# Patient Record
Sex: Male | Born: 1985 | Race: Black or African American | Hispanic: No | Marital: Single | Smoking: Current some day smoker
Health system: Southern US, Community
[De-identification: ages and names within clinical notes are randomized; demographics above are authoritative.]

## PROBLEM LIST (undated history)

## (undated) ENCOUNTER — Emergency Department: Admission: EM | Payer: Self-pay | Source: Home / Self Care

## (undated) HISTORY — PX: KNEE SURGERY: SHX244

## (undated) HISTORY — PX: ANTERIOR CRUCIATE LIGAMENT REPAIR: SHX115

---

## 2008-07-17 ENCOUNTER — Emergency Department: Payer: Self-pay | Admitting: Emergency Medicine

## 2008-09-15 ENCOUNTER — Emergency Department: Payer: Self-pay | Admitting: Emergency Medicine

## 2009-01-23 ENCOUNTER — Emergency Department: Payer: Self-pay | Admitting: Internal Medicine

## 2009-07-21 ENCOUNTER — Emergency Department: Payer: Self-pay | Admitting: Emergency Medicine

## 2009-11-03 ENCOUNTER — Emergency Department: Payer: Self-pay | Admitting: Internal Medicine

## 2011-01-18 ENCOUNTER — Emergency Department: Payer: Self-pay | Admitting: Emergency Medicine

## 2011-08-26 ENCOUNTER — Emergency Department: Payer: Self-pay | Admitting: Unknown Physician Specialty

## 2014-01-28 ENCOUNTER — Emergency Department: Payer: Self-pay | Admitting: Emergency Medicine

## 2014-12-06 ENCOUNTER — Emergency Department: Payer: Self-pay | Admitting: Student

## 2015-10-14 ENCOUNTER — Emergency Department: Payer: No Typology Code available for payment source

## 2015-10-14 ENCOUNTER — Encounter: Payer: Self-pay | Admitting: Emergency Medicine

## 2015-10-14 ENCOUNTER — Emergency Department
Admission: EM | Admit: 2015-10-14 | Discharge: 2015-10-14 | Disposition: A | Discharge status: Home / Self Care | Payer: No Typology Code available for payment source | Attending: Emergency Medicine | Admitting: Emergency Medicine

## 2015-10-14 DIAGNOSIS — Y9389 Activity, other specified: Secondary | ICD-10-CM | POA: Diagnosis not present

## 2015-10-14 DIAGNOSIS — S161XXA Strain of muscle, fascia and tendon at neck level, initial encounter: Secondary | ICD-10-CM

## 2015-10-14 DIAGNOSIS — S0990XA Unspecified injury of head, initial encounter: Secondary | ICD-10-CM | POA: Insufficient documentation

## 2015-10-14 DIAGNOSIS — F17200 Nicotine dependence, unspecified, uncomplicated: Secondary | ICD-10-CM | POA: Diagnosis not present

## 2015-10-14 DIAGNOSIS — S5002XA Contusion of left elbow, initial encounter: Secondary | ICD-10-CM

## 2015-10-14 DIAGNOSIS — M25511 Pain in right shoulder: Secondary | ICD-10-CM

## 2015-10-14 DIAGNOSIS — S4991XA Unspecified injury of right shoulder and upper arm, initial encounter: Secondary | ICD-10-CM | POA: Insufficient documentation

## 2015-10-14 DIAGNOSIS — Y92410 Unspecified street and highway as the place of occurrence of the external cause: Secondary | ICD-10-CM | POA: Diagnosis not present

## 2015-10-14 DIAGNOSIS — Y998 Other external cause status: Secondary | ICD-10-CM | POA: Insufficient documentation

## 2015-10-14 MED ORDER — OXYCODONE-ACETAMINOPHEN 5-325 MG PO TABS
1.0000 | ORAL_TABLET | Freq: Once | ORAL | Status: AC
  Administered 2015-10-14: 1 via ORAL
  Filled 2015-10-14: qty 1

## 2015-10-14 MED ORDER — IBUPROFEN 800 MG PO TABS: 800.0000 mg | ORAL_TABLET | Freq: Three times a day (TID) | ORAL | Status: DC | PRN

## 2015-10-14 MED ORDER — OXYCODONE-ACETAMINOPHEN 5-325 MG PO TABS: 1.0000 | ORAL_TABLET | ORAL | Status: DC | PRN
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
## 2015-10-14 NOTE — ED Notes (Signed)
Pt refusing wheelchair. 

## 2015-10-14 NOTE — ED Notes (Signed)
Xray called and states that they will be delaying the xray until the patients C spine is cleared due to patient c/o of head and neck pain.

## 2015-10-14 NOTE — Discharge Instructions (Signed)
You may apply ice to your right shoulder and left elbow for 20 minutes every 2-3 hours.  You may similarly apply ice or heat to your neck for 20 minutes every 2-3 hours.  You may take Motrin for mild to moderate pain and Percocet for severe pain.  Do not drive within 8 hours of taking Percocet.  Return to the emergency department for severe pain, vomiting, numbness tingling or weakness, or any other symptoms concerning to you.  Cervical Sprain A cervical sprain is an injury in the neck in which the strong, fibrous tissues (ligaments) that connect your neck bones stretch or tear. Cervical sprains can range from mild to severe. Severe cervical sprains can cause the neck vertebrae to be unstable. This can lead to damage of the spinal cord and can result in serious nervous system problems. The amount of time it takes for a cervical sprain to get better depends on the cause and extent of the injury. Most cervical sprains heal in 1 to 3 weeks. CAUSES  Severe cervical sprains may be caused by:   Contact sport injuries (such as from football, rugby, wrestling, hockey, auto racing, gymnastics, diving, martial arts, or boxing).   Motor vehicle collisions.   Whiplash injuries. This is an injury from a sudden forward and backward whipping movement of the head and neck.  Falls.  Mild cervical sprains may be caused by:   Being in an awkward position, such as while cradling a telephone between your ear and shoulder.   Sitting in a chair that does not offer proper support.   Working at a poorly Marketing executive station.   Looking up or down for long periods of time.  SYMPTOMS   Pain, soreness, stiffness, or a burning sensation in the front, back, or sides of the neck. This discomfort may develop immediately after the injury or slowly, 24 hours or more after the injury.   Pain or tenderness directly in the middle of the back of the neck.   Shoulder or upper back pain.   Limited  ability to move the neck.   Headache.   Dizziness.   Weakness, numbness, or tingling in the hands or arms.   Muscle spasms.   Difficulty swallowing or chewing.   Tenderness and swelling of the neck.  DIAGNOSIS  Most of the time your health care provider can diagnose a cervical sprain by taking your history and doing a physical exam. Your health care provider will ask about previous neck injuries and any known neck problems, such as arthritis in the neck. X-rays may be taken to find out if there are any other problems, such as with the bones of the neck. Other tests, such as a CT scan or MRI, may also be needed.  TREATMENT  Treatment depends on the severity of the cervical sprain. Mild sprains can be treated with rest, keeping the neck in place (immobilization), and pain medicines. Severe cervical sprains are immediately immobilized. Further treatment is done to help with pain, muscle spasms, and other symptoms and may include:  Medicines, such as pain relievers, numbing medicines, or muscle relaxants.   Physical therapy. This may involve stretching exercises, strengthening exercises, and posture training. Exercises and improved posture can help stabilize the neck, strengthen muscles, and help stop symptoms from returning.  HOME CARE INSTRUCTIONS   Put ice on the injured area.   Put ice in a plastic bag.   Place a towel between your skin and the bag.   Leave the ice  on for 15-20 minutes, 3-4 times a day.   If your injury was severe, you may have been given a cervical collar to wear. A cervical collar is a two-piece collar designed to keep your neck from moving while it heals.  Do not remove the collar unless instructed by your health care provider.  If you have long hair, keep it outside of the collar.  Ask your health care provider before making any adjustments to your collar. Minor adjustments may be required over time to improve comfort and reduce pressure on your  chin or on the back of your head.  Ifyou are allowed to remove the collar for cleaning or bathing, follow your health care provider's instructions on how to do so safely.  Keep your collar clean by wiping it with mild soap and water and drying it completely. If the collar you have been given includes removable pads, remove them every 1-2 days and hand wash them with soap and water. Allow them to air dry. They should be completely dry before you wear them in the collar.  If you are allowed to remove the collar for cleaning and bathing, wash and dry the skin of your neck. Check your skin for irritation or sores. If you see any, tell your health care provider.  Do not drive while wearing the collar.   Only take over-the-counter or prescription medicines for pain, discomfort, or fever as directed by your health care provider.   Keep all follow-up appointments as directed by your health care provider.   Keep all physical therapy appointments as directed by your health care provider.   Make any needed adjustments to your workstation to promote good posture.   Avoid positions and activities that make your symptoms worse.   Warm up and stretch before being active to help prevent problems.  SEEK MEDICAL CARE IF:   Your pain is not controlled with medicine.   You are unable to decrease your pain medicine over time as planned.   Your activity level is not improving as expected.  SEEK IMMEDIATE MEDICAL CARE IF:   You develop any bleeding.  You develop stomach upset.  You have signs of an allergic reaction to your medicine.   Your symptoms get worse.   You develop new, unexplained symptoms.   You have numbness, tingling, weakness, or paralysis in any part of your body.  MAKE SURE YOU:   Understand these instructions.  Will watch your condition.  Will get help right away if you are not doing well or get worse.   This information is not intended to replace advice  given to you by your health care provider. Make sure you discuss any questions you have with your health care provider.   Document Released: 06/28/2007 Document Revised: 09/05/2013 Document Reviewed: 03/08/2013 Elsevier Interactive Patient Education [employer]
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
## 2015-10-14 NOTE — ED Notes (Signed)
Patient transported to x-ray. ?

## 2016-02-10 ENCOUNTER — Emergency Department
Admission: EM | Admit: 2016-02-10 | Discharge: 2016-02-10 | Disposition: A | Discharge status: Home / Self Care | Payer: No Typology Code available for payment source | Attending: Emergency Medicine | Admitting: Emergency Medicine

## 2016-02-10 DIAGNOSIS — K047 Periapical abscess without sinus: Secondary | ICD-10-CM | POA: Insufficient documentation

## 2016-02-10 DIAGNOSIS — F17200 Nicotine dependence, unspecified, uncomplicated: Secondary | ICD-10-CM | POA: Insufficient documentation

## 2016-02-10 MED ORDER — AMOXICILLIN 500 MG PO CAPS
1000.0000 mg | ORAL_CAPSULE | Freq: Once | ORAL | Status: AC
  Administered 2016-02-10: 1000 mg via ORAL
  Filled 2016-02-10: qty 2

## 2016-02-10 MED ORDER — LIDOCAINE-EPINEPHRINE 2 %-1:100000 IJ SOLN
INTRAMUSCULAR | Status: AC
  Administered 2016-02-10: 22:00:00
  Filled 2016-02-10: qty 1.7

## 2016-02-10 MED ORDER — AMOXICILLIN 500 MG PO TABS: 500.0000 mg | ORAL_TABLET | Freq: Three times a day (TID) | ORAL | Status: DC

## 2016-02-10 MED ORDER — HYDROCODONE-ACETAMINOPHEN 5-325 MG PO TABS: 1.0000 | ORAL_TABLET | ORAL | Status: DC | PRN

## 2016-02-10 NOTE — ED Notes (Signed)
Pt presents with right upper tooth is causing him pain since last night. Pt states he tried to pull it himself to make the pain go away last night. Pt states Tylenol is not touching the pain. Slight facial swelling noted.

## 2016-02-10 NOTE — ED Notes (Signed)
Discharge instructions reviewed with patient. Questions fielded by this RN. Patient verbalizes understanding of instructions. Patient discharged home in stable condition per [place] NP . No acute distress noted at time of discharge.   

## 2016-02-10 NOTE — ED Provider Notes (Signed)
[facility] Emergency Department Provider Note ____________________________________________  Time seen: Approximately 9:39 PM  I have reviewed the triage vital signs and the nursing notes.   HISTORY  Chief Complaint Dental Pain   HPI Gregory Oneal is a 30 y.o. male who presents to the emergency department tonight for evaluation of dental pain. He has had chronic dental fractures for quite some time, but they have not been painful until the past 24 hours. He states that he actually attempted to pull his tooth last night without any success. He has been taking Tylenol without relief. He denies fever.   History reviewed. No pertinent past medical history.  There are no active problems to display for this patient.   Past Surgical History  Procedure Laterality Date  . Knee surgery      Current Outpatient Rx  Name  Route  Sig  Dispense  Refill  . amoxicillin (AMOXIL) 500 MG tablet   Oral   Take 1 tablet (500 mg total) by mouth 3 (three) times daily.   30 tablet   0   . HYDROcodone-acetaminophen (NORCO/VICODIN) 5-325 MG tablet   Oral   Take 1 tablet by mouth every 4 (four) hours as needed.   12 tablet   0   . ibuprofen (ADVIL,MOTRIN) 800 MG tablet   Oral   Take 1 tablet (800 mg total) by mouth every 8 (eight) hours as needed.   20 tablet   0   . oxyCODONE-acetaminophen (PERCOCET/ROXICET) 5-325 MG tablet   Oral   Take 1 tablet by mouth every 4 (four) hours as needed for severe pain.   15 tablet   0     Allergies Review of patient's allergies indicates no known allergies.  No family history on file.  Social History Social History  Substance Use Topics  . Smoking status: Current Some Day Smoker  . Smokeless tobacco: None  . Alcohol Use: Yes     Comment: sometimes    Review of Systems Constitutional: Well appearing ENT: Positive for dental pain Musculoskeletal: Negative for jaw pain/trismus. Skin: Positive for upper jaw  swelling ____________________________________________   PHYSICAL EXAM:  VITAL SIGNS: ED Triage Vitals  Enc Vitals Group     BP 02/10/16 2107 134/81 mmHg     Pulse Rate 02/10/16 2107 61     Resp 02/10/16 2107 18     Temp 02/10/16 2107 98.4 F (36.9 C)     Temp Source 02/10/16 2107 Oral     SpO2 02/10/16 2107 99 %     Weight 02/10/16 2107 270 lb (122.471 kg)     Height 02/10/16 2107 6\' 5"  (1.956 m)     Head Cir --      Peak Flow --      Pain Score 02/10/16 2110 10     Pain Loc --      Pain Edu? --      Excl. in GC? --     Constitutional: Alert and oriented. Well appearing and in no acute distress. Eyes: Conjunctivae are normal.EOMI. Mouth/Throat: Mucous membranes are moist. Oropharynx non-erythematous. Periodontal Exam    Hematological/Lymphatic/Immunilogical: No cervical lymphadenopathy. Respiratory: Normal respiratory effort.  Musculoskeletal: Full ROM x 4 extremities. Neurologic:  Normal speech and language. No gross focal neurologic deficits are appreciated. Speech is normal. No gait instability. Skin:  Mild edema of the right cheek. Psychiatric: Mood and affect are normal. Speech and behavior are normal.  ____________________________________________   LABS (all labs ordered are listed, but only abnormal  results are displayed)  Labs Reviewed - No data to display ____________________________________________   RADIOLOGY  Not indicated ____________________________________________   PROCEDURES  Procedure(s) performed:  Dental block performed with lidocaine and epinephrine. Superior alveolar block completed successfully with no immediate complications.  Critical Care performed: No  ____________________________________________   INITIAL IMPRESSION / ASSESSMENT AND PLAN / ED COURSE  Pertinent labs & imaging results that were available during my care of the patient were reviewed by me and considered in my medical decision making (see chart for  details). Patient will be prescribed amoxicillin and Norco. Patient was advised to see the dentist within 14 days. Also advised to take the antibiotic until finished. Instructed to return to the ER for symptoms that change or worsen if you are unable to schedule an appointment. ____________________________________________   FINAL CLINICAL IMPRESSION(S) / ED DIAGNOSES  Final diagnoses:  Dental abscess    Note:  This document was prepared using Dragon voice recognition software and may include unintentional dictation errors.    Chinita Pester, FNP 02/10/16 2213  Jene Every, MD 02/10/16 2234

## 2016-02-10 NOTE — Discharge Instructions (Signed)

## 2016-02-10 NOTE — ED Notes (Signed)
Right upper tooth is causing him pain. States he tried to pull it himself to make the pain go away. States Tylenol is not touching the pain. Slight facial swelling noted.

## 2016-06-15 ENCOUNTER — Emergency Department
Admission: EM | Admit: 2016-06-15 | Discharge: 2016-06-15 | Disposition: A | Discharge status: Home / Self Care | Payer: Self-pay | Attending: Emergency Medicine | Admitting: Emergency Medicine

## 2016-06-15 ENCOUNTER — Encounter: Payer: Self-pay | Admitting: Emergency Medicine

## 2016-06-15 DIAGNOSIS — Y999 Unspecified external cause status: Secondary | ICD-10-CM | POA: Insufficient documentation

## 2016-06-15 DIAGNOSIS — Y939 Activity, unspecified: Secondary | ICD-10-CM | POA: Insufficient documentation

## 2016-06-15 DIAGNOSIS — F17200 Nicotine dependence, unspecified, uncomplicated: Secondary | ICD-10-CM | POA: Insufficient documentation

## 2016-06-15 DIAGNOSIS — Z79899 Other long term (current) drug therapy: Secondary | ICD-10-CM | POA: Insufficient documentation

## 2016-06-15 DIAGNOSIS — K029 Dental caries, unspecified: Secondary | ICD-10-CM

## 2016-06-15 DIAGNOSIS — K0889 Other specified disorders of teeth and supporting structures: Secondary | ICD-10-CM | POA: Insufficient documentation

## 2016-06-15 DIAGNOSIS — Y929 Unspecified place or not applicable: Secondary | ICD-10-CM | POA: Insufficient documentation

## 2016-06-15 DIAGNOSIS — Z5321 Procedure and treatment not carried out due to patient leaving prior to being seen by health care provider: Secondary | ICD-10-CM | POA: Insufficient documentation

## 2016-06-15 DIAGNOSIS — S025XXA Fracture of tooth (traumatic), initial encounter for closed fracture: Secondary | ICD-10-CM | POA: Insufficient documentation

## 2016-06-15 DIAGNOSIS — Z792 Long term (current) use of antibiotics: Secondary | ICD-10-CM | POA: Insufficient documentation

## 2016-06-15 DIAGNOSIS — X58XXXA Exposure to other specified factors, initial encounter: Secondary | ICD-10-CM | POA: Insufficient documentation

## 2016-06-15 MED ORDER — TRAMADOL HCL 50 MG PO TABS: 50.0000 mg | ORAL_TABLET | Freq: Four times a day (QID) | ORAL | 0 refills | Status: AC | PRN

## 2016-06-15 MED ORDER — LIDOCAINE VISCOUS 2 % MT SOLN
15.0000 mL | Freq: Once | OROMUCOSAL | Status: AC
  Administered 2016-06-15: 15 mL via OROMUCOSAL
  Filled 2016-06-15: qty 15

## 2016-06-15 MED ORDER — LIDOCAINE VISCOUS 2 % MT SOLN: 5.0000 mL | Freq: Four times a day (QID) | OROMUCOSAL | 0 refills | Status: DC | PRN

## 2016-06-15 MED ORDER — AMOXICILLIN 500 MG PO CAPS: 500.0000 mg | ORAL_CAPSULE | Freq: Three times a day (TID) | ORAL | 0 refills | Status: DC

## 2016-06-15 NOTE — ED Triage Notes (Signed)
Dental pain which started 3 days ago  States he broke a tooth

## 2016-06-15 NOTE — ED Triage Notes (Signed)
Pt just seen for dental pain and was discharged. Pt states the numbing cream is not working and request to speak with the doctor pt states " I want some tylenol". Pt advised that he would have to check back in to see the doctor. States he has money to go get some tylenol but does not want to.

## 2016-06-15 NOTE — ED Provider Notes (Signed)
[facility] Emergency Department Provider Note   ____________________________________________   First MD Initiated Contact with Patient 06/15/16 1355     (approximate)  I have reviewed the triage vital signs and the nursing notes.   HISTORY  Chief Complaint No chief complaint on file.    HPI Gregory Oneal is a 30 y.o. male patient complaining of dental pain for 3 days. Patient stated pain is secondary to a fractured tooth right upper molar. Patient also has multiple dental caries. Patient state taking over-the-counter anti-inflammatory medication with no results with pain relief. Patient rates pain as a 10 over 10. No other palliative measures for this complaint. Patient seen for previous complaint 4 months ago. Patient has not follow-up with dentist as directed. Patient say felt better after taking antibiotics and pain medication. Patient admits to not having dental insurance.   No past medical history on file.  There are no active problems to display for this patient.   Past Surgical History:  Procedure Laterality Date  . KNEE SURGERY      Prior to Admission medications   Medication Sig Start Date End Date Taking? Authorizing Provider  amoxicillin (AMOXIL) 500 MG capsule Take 1 capsule (500 mg total) by mouth 3 (three) times daily. 06/15/16   Joni Reiningonald K Linley Moxley, PA-C  amoxicillin (AMOXIL) 500 MG tablet Take 1 tablet (500 mg total) by mouth 3 (three) times daily. 02/10/16   Chinita Pesterari B Triplett, FNP  HYDROcodone-acetaminophen (NORCO/VICODIN) 5-325 MG tablet Take 1 tablet by mouth every 4 (four) hours as needed. 02/10/16   Chinita Pesterari B Triplett, FNP  ibuprofen (ADVIL,MOTRIN) 800 MG tablet Take 1 tablet (800 mg total) by mouth every 8 (eight) hours as needed. 10/14/15   Anne-Caroline Sharma CovertNorman, MD  lidocaine (XYLOCAINE) 2 % solution Use as directed 5 mLs in the mouth or throat every 6 (six) hours as needed for mouth pain. 06/15/16   Joni Reiningonald K Dianelly Ferran, PA-C    oxyCODONE-acetaminophen (PERCOCET/ROXICET) 5-325 MG tablet Take 1 tablet by mouth every 4 (four) hours as needed for severe pain. 10/14/15   Anne-Caroline Sharma CovertNorman, MD  traMADol (ULTRAM) 50 MG tablet Take 1 tablet (50 mg total) by mouth every 6 (six) hours as needed. 06/15/16 06/15/17  Joni Reiningonald K Jaysin Gayler, PA-C    Allergies Review of patient's allergies indicates no known allergies.  No family history on file.  Social History Social History  Substance Use Topics  . Smoking status: Current Some Day Smoker  . Smokeless tobacco: Not on file  . Alcohol use Yes     Comment: sometimes    Review of Systems Constitutional: No fever/chills Eyes: No visual changes. ENT: No sore throat.Dental pain Cardiovascular: Denies chest pain. Respiratory: Denies shortness of breath. Gastrointestinal: No abdominal pain.  No nausea, no vomiting.  No diarrhea.  No constipation. Genitourinary: Negative for dysuria. Musculoskeletal: Negative for back pain. Skin: Negative for rash. Neurological: Negative for headaches, focal weakness or numbness.    ____________________________________________   PHYSICAL EXAM:  VITAL SIGNS: ED Triage Vitals  Enc Vitals Group     BP 06/15/16 1351 137/77     Pulse Rate 06/15/16 1351 76     Resp 06/15/16 1351 18     Temp 06/15/16 1351 98.1 F (36.7 C)     Temp Source 06/15/16 1351 Oral     SpO2 06/15/16 1351 99 %     Weight 06/15/16 1352 270 lb (122.5 kg)     Height 06/15/16 1352 6\' 5"  (1.956 m)  Head Circumference --      Peak Flow --      Pain Score --      Pain Loc --      Pain Edu? --      Excl. in GC? --     Constitutional: Alert and oriented. Well appearing and in no acute distress. Eyes: Conjunctivae are normal. PERRL. EOMI. Head: Atraumatic. Nose: No congestion/rhinnorhea. Mouth/Throat: Mucous membranes are moist. Fractured tooth #14 with multiple caries upper and lower denture. Oropharynx non-erythematous. Neck: No stridor.  No cervical spine  tenderness to palpation. Hematological/Lymphatic/Immunilogical: No cervical lymphadenopathy. Cardiovascular: Normal rate, regular rhythm. Grossly normal heart sounds.  Good peripheral circulation. Respiratory: Normal respiratory effort.  No retractions. Lungs CTAB. Gastrointestinal: Soft and nontender. No distention. No abdominal bruits. No CVA tenderness. Musculoskeletal: No lower extremity tenderness nor edema.  No joint effusions. Neurologic:  Normal speech and language. No gross focal neurologic deficits are appreciated. No gait instability. Skin:  Skin is warm, dry and intact. No rash noted. Psychiatric: Mood and affect are normal. Speech and behavior are normal.  ____________________________________________   LABS (all labs ordered are listed, but only abnormal results are displayed)  Labs Reviewed - No data to display ____________________________________________  EKG   ____________________________________________  RADIOLOGY   ____________________________________________   PROCEDURES  Procedure(s) performed: None  Procedures  Critical Care performed: No  ____________________________________________   INITIAL IMPRESSION / ASSESSMENT AND PLAN / ED COURSE  Pertinent labs & imaging results that were available during my care of the patient were reviewed by me and considered in my medical decision making (see chart for details).  Dental pain secondary to fracture tooth and multiple caries. Patient given discharge care instructions. Patient given a list of dental clinic for follow-up care. Patient prescription for amoxicillin, viscous lidocaine and tramadol.  Clinical Course     ____________________________________________   FINAL CLINICAL IMPRESSION(S) / ED DIAGNOSES  Final diagnoses:  Pain due to dental caries      NEW MEDICATIONS STARTED DURING THIS VISIT:  New Prescriptions   AMOXICILLIN (AMOXIL) 500 MG CAPSULE    Take 1 capsule (500 mg total) by  mouth 3 (three) times daily.   LIDOCAINE (XYLOCAINE) 2 % SOLUTION    Use as directed 5 mLs in the mouth or throat every 6 (six) hours as needed for mouth pain.   TRAMADOL (ULTRAM) 50 MG TABLET    Take 1 tablet (50 mg total) by mouth every 6 (six) hours as needed.     Note:  This document was prepared using Dragon voice recognition software and may include unintentional dictation errors.    Joni Reining, PA-C 06/15/16 1403    Nita Sickle, MD 06/16/16 [phone]

## 2016-06-15 NOTE — Discharge Instructions (Signed)
Follow-up for list of dental clinics provided. °OPTIONS FOR DENTAL FOLLOW UP CARE ° °[place] [facility] - Local Safety Net Dental Clinics °http://www.ncdhhs.gov/dph/oralhealth/services/safetynetclinics.htm °  °[facility] ([phone]) ° °[facility] [place] ([phone]) ° °[facility] ([phone] ext [phone]) ° °[facility] ([phone]) ° °[facility] ([phone]) °This clinic caters to the indigent population and is on a lottery system. °Location: °[facility], [address], [place] °Clinic Hours: °Wednesdays from 6pm - 9pm, patients seen by a lottery system. °For dates, call or go to www.med.[facility].edu/[facility]/patients/Dental-[facility] °Services: °Cleanings, fillings and simple extractions. °Payment Options: °DENTAL WORK IS FREE OF CHARGE. Bring proof of income or support. °Best way to get seen: °Arrive at 5:15 pm - this is a lottery, NOT first come/first serve, so arriving earlier will not increase your chances of being seen. °  °  °[facility] °[phone] °Select option 1 for emergencies °  °Location: °[facility], [address], [place] °Clinic Hours: °No walk-ins accepted - call the day before to schedule an appointment. °Check in times are 9:30 am and 1:30 pm. °Services: °Simple extractions, temporary fillings, pulpectomy/pulp debridement, uncomplicated abscess drainage. °Payment Options: °PAYMENT IS DUE AT THE TIME OF SERVICE.  Fee is usually $100-200, additional surgical procedures (e.g. abscess drainage) may be extra. °Cash, checks, Visa/MasterCard accepted.  Can file Medicaid if patient is covered for dental - patient should call case worker to check. °No discount for [facility] patients. °Best way to get seen: °MUST call the day before and get onto the schedule. Can usually be seen the next 1-2 days. No walk-ins accepted. °  °   °[facility] °[phone] °  °Location: °[facility], [address], [place] °Clinic Hours: °M, W, Th, F 8am or 1:30pm, Tues 9a or 1:30 - first come/first served. °Services: °Simple extractions, temporary fillings, uncomplicated abscess drainage.  You do not need to be an [place] resident. °Payment Options: °PAYMENT IS DUE AT THE TIME OF SERVICE. °Dental insurance, otherwise sliding scale - bring proof of income or support. °Depending on income and treatment needed, cost is usually $50-200. °Best way to get seen: °Arrive early as it is first come/first served. °  °  °[facility] °[phone] °  °Location: °[address] °Clinic Hours: °Mon-Thu 8a-5p °Services: °Most basic dental services including extractions and fillings. °Payment Options: °PAYMENT IS DUE AT THE TIME OF SERVICE. °Sliding scale, up to 50% off - bring proof if income or support. °Medicaid with dental option accepted. °Best way to get seen: °Call to schedule an appointment, can usually be seen within 2 weeks OR they will try to see walk-ins - show up at 8a or 2p (you may have to wait). °  °  °[facility] °[phone] °[place] RESIDENTS ONLY °  °Location: °[facility], [address], [place], [place] [address] °Clinic Hours: By appointment only. °Monday - Thursday 8am-5pm, Friday 8am-12pm °Services: Cleanings, fillings, extractions. °Payment Options: °PAYMENT IS DUE AT THE TIME OF SERVICE. °Cash, Visa or MasterCard. Sliding scale - $30 minimum per service. °Best way to get seen: °Come in to office, complete packet and make an appointment - need proof of income °or support monies for each household member and proof of [place] residence. °Usually takes about a month to get in. °  °  °[facility] °  [phone] °  °Location: °[address] [place] °Clinic Hours: Walk-in Urgent Care  Dental Services are offered Monday-Friday mornings only. °The numbers of emergencies accepted daily is limited to the number of °providers available. °Maximum 15 - Mondays, Wednesdays & Thursdays °Maximum 10 - Tuesdays & Fridays °Services: °You do not need to be a [place] resident to be seen for a dental emergency. °Emergencies are defined as pain, swelling, abnormal bleeding, or dental trauma. Walkins will receive x-rays if needed. °NOTE: Dental cleaning is not an emergency. °Payment Options: °PAYMENT IS DUE AT THE TIME OF SERVICE. °Minimum co-pay is $40.00 for uninsured patients. °Minimum co-pay is $3.00 for Medicaid with dental coverage. °Dental Insurance is accepted and must be presented at time of visit. °Medicare does not cover dental. °Forms of payment: Cash, credit card, checks. °Best way to get seen: °If not previously registered with the clinic, walk-in dental registration begins at 7:15 am and is on a first come/first serve basis. °If previously registered with the clinic, call to make an appointment. °  °  °[facility] °[phone] °[place] RESIDENTS ONLY °  °Location: °[address], [place], [place] °Clinic Hours: °Mon-Thu 10a-2p °Services: Extractions only! °Payment Options: °FREE (donations accepted) - bring proof of income or support °Best way to get seen: °Call and schedule an appointment OR come at 8am on the 1st Monday of every month (except for holidays) when it is first come/first served. °  °  °[facility] °[phone] °  °Location: °[address], [place] °Clinic Hours: °Friday mornings °Services, Payment Options, Best way to get seen: °Call for info ° °

## 2017-05-19 ENCOUNTER — Emergency Department
Admission: EM | Admit: 2017-05-19 | Discharge: 2017-05-19 | Disposition: A | Discharge status: Home / Self Care | Payer: Self-pay | Attending: Emergency Medicine | Admitting: Emergency Medicine

## 2017-05-19 ENCOUNTER — Encounter: Payer: Self-pay | Admitting: *Deleted

## 2017-05-19 DIAGNOSIS — K029 Dental caries, unspecified: Secondary | ICD-10-CM | POA: Insufficient documentation

## 2017-05-19 DIAGNOSIS — F17200 Nicotine dependence, unspecified, uncomplicated: Secondary | ICD-10-CM | POA: Insufficient documentation

## 2017-05-19 DIAGNOSIS — K047 Periapical abscess without sinus: Secondary | ICD-10-CM | POA: Insufficient documentation

## 2017-05-19 MED ORDER — PENICILLIN V POTASSIUM 500 MG PO TABS: 500.0000 mg | ORAL_TABLET | Freq: Four times a day (QID) | ORAL | 0 refills | Status: DC

## 2017-05-19 MED ORDER — PENICILLIN V POTASSIUM 500 MG PO TABS
500.0000 mg | ORAL_TABLET | Freq: Once | ORAL | Status: AC
  Administered 2017-05-19: 500 mg via ORAL
  Filled 2017-05-19: qty 1

## 2017-05-19 MED ORDER — LIDOCAINE-EPINEPHRINE 2 %-1:100000 IJ SOLN
1.7000 mL | Freq: Once | INTRAMUSCULAR | Status: AC
  Administered 2017-05-19: 1.7 mL
  Filled 2017-05-19: qty 1.7

## 2017-05-19 NOTE — ED Triage Notes (Signed)
Pt has right upper toothache since yesterday.  Pt taking otc meds without relief.  Pt alert.

## 2017-05-19 NOTE — ED Provider Notes (Signed)
[facility] Emergency Department Provider Note ____________________________________________  Time seen: 2220  I have reviewed the triage vital signs and the nursing notes.  HISTORY  Chief Complaint  Dental Pain  HPI Gregory Oneal is a 31 y.o. male Presents to the ED for evaluation of right upper tooth pain since yesterday. 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 He reports some purulent drainage from the buccal area of the same tooth. He denies any fevers, chills, sweats. 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  No past medical history on file.  There are no active problems to display for this patient.   Past Surgical History:  Procedure Laterality Date  . KNEE SURGERY      Prior to Admission medications   Medication Sig Start Date End Date Taking? Authorizing Provider  amoxicillin (AMOXIL) 500 MG capsule Take 1 capsule (500 mg total) by mouth 3 (three) times daily. 06/15/16   Joni Reining, PA-C  amoxicillin (AMOXIL) 500 MG tablet Take 1 tablet (500 mg total) by mouth 3 (three) times daily. 02/10/16   Triplett, Rulon Eisenmenger B, FNP  HYDROcodone-acetaminophen (NORCO/VICODIN) 5-325 MG tablet Take 1 tablet by mouth every 4 (four) hours as needed. 02/10/16   Triplett, Cari B, FNP  ibuprofen (ADVIL,MOTRIN) 800 MG tablet Take 1 tablet (800 mg total) by mouth every 8 (eight) hours as needed. 10/14/15   Rockne Menghini, MD  lidocaine (XYLOCAINE) 2 % solution Use as directed 5 mLs in the mouth or throat every 6 (six) hours as needed for mouth pain. 06/15/16   Joni Reining, PA-C  oxyCODONE-acetaminophen (PERCOCET/ROXICET) 5-325 MG tablet Take 1 tablet by mouth every 4 (four) hours as needed for severe pain. 10/14/15   Rockne Menghini, MD   penicillin v potassium (VEETID) 500 MG tablet Take 1 tablet (500 mg total) by mouth 4 (four) times daily. 05/19/17   Leily Capek, Charlesetta Ivory, PA-C  traMADol (ULTRAM) 50 MG tablet Take 1 tablet (50 mg total) by mouth every 6 (six) hours as needed. 06/15/16 06/15/17  Joni Reining, PA-C    Allergies Patient has no known allergies.  No family history on file.  Social History Social History  Substance Use Topics  . Smoking status: Current Some Day Smoker  . Smokeless tobacco: Never Used  . Alcohol use Yes     Comment: sometimes    Review of Systems  Constitutional: Negative for fever. Eyes: Negative for visual changes. ENT: Negative for sore throat. Dental pain as above Cardiovascular: Negative for chest pain. Respiratory: Negative for shortness of breath. Neurological: Negative for headaches, focal weakness or numbness. ____________________________________________  PHYSICAL EXAM:  VITAL SIGNS: ED Triage Vitals  Enc Vitals Group     BP 05/19/17 2100 129/67     Pulse Rate 05/19/17 2100 85     Resp 05/19/17 2100 18     Temp 05/19/17 2100 98.2 F (36.8 C)     Temp Source 05/19/17 2100 Oral     SpO2 05/19/17 2100 99 %     Weight 05/19/17 2058 275 lb (124.7 kg)     Height 05/19/17 2058 6\' 5"  (1.956 m)     Head Circumference --      Peak Flow --      Pain Score 05/19/17 2058 8  Pain Loc --      Pain Edu? --      Excl. in GC? --     Constitutional: Alert and oriented. Well appearing and in no distress. Head: Normocephalic and atraumatic. Eyes: Conjunctivae are normal. Normal extraocular movements Ears: Canals clear. TMs intact bilaterally. Mouth/Throat: Mucous membranes are moist. Patient with poor dentition primarily to the upper and lower molars. The right upper jaw where the patient localizes pain shows a fractured primary molar. He has a chronically fractured second and third molar to the gumline. No focal gum swelling is appreciated. No pointing fluctuance is noted.  No brawny sublingual erythema is noted. Neck: Supple. No thyromegaly. Hematological/Lymphatic/Immunological: No cervical lymphadenopathy. Cardiovascular: Normal rate, regular rhythm. Normal distal pulses. Respiratory: Normal respiratory effort. No wheezes/rales/rhonchi. ____________________________________________  PROCEDURES  Pen VK 500 mg PO  DENTAL BLOCK  Performed by: Lissa HoardMenshew, Aleyah Balik V Bacon Consent: Verbal consent obtained. Required items: devices and special equipment available Time out: Immediately prior to procedure a "time out" was called to verify the correct patient, procedure, equipment, support staff and site/side marked as required.  Indication: pain Nerve block body site: right upper molar  Preparation: Patient was prepped and draped in the usual sterile fashion. Needle gauge: 27 G Location technique: anatomical landmarks  Local anesthetic: lido-epi 2%-1:100000  Anesthetic total: 3 ml  Outcome: pain improved Patient tolerance: Patient tolerated the procedure well with no immediate complications. ____________________________________________  INITIAL IMPRESSION / ASSESSMENT AND PLAN / ED COURSE  Patient with ED evaluation of acute dental pain secondary to chronic chronic dental caries and chronic dental fracture toe gumline. Patient is beng treated for dental abscess with penicillin VK and will be discharged with instructions to follow-up with a dental provider on his dental insurance. ____________________________________________  FINAL CLINICAL IMPRESSION(S) / ED DIAGNOSES  Final diagnoses:  Pain due to dental caries  Dental infection      Lissa HoardMenshew, Camreigh Michie V Bacon, PA-C 05/21/17 0052    Loleta RoseForbach, Cory, MD 05/21/17 [phone]

## 2017-05-19 NOTE — Discharge Instructions (Signed)
Take the prescription antibiotic as directed. Rinse with warm salt-water after every meal. Use a soft-bristled toothbrush twice daily. Select and follow-up with dental provider on your dental insurance plan for definitive treatment.

## 2017-05-19 NOTE — ED Notes (Signed)
Pt ambulatory at time of discharge. Pt denied having questions. Pt in NAD.

## 2018-02-10 ENCOUNTER — Other Ambulatory Visit: Payer: Self-pay

## 2018-02-10 ENCOUNTER — Encounter: Payer: Self-pay | Admitting: *Deleted

## 2018-02-10 ENCOUNTER — Emergency Department
Admission: EM | Admit: 2018-02-10 | Discharge: 2018-02-10 | Disposition: A | Discharge status: Home / Self Care | Payer: Self-pay | Attending: Emergency Medicine | Admitting: Emergency Medicine

## 2018-02-10 DIAGNOSIS — F17200 Nicotine dependence, unspecified, uncomplicated: Secondary | ICD-10-CM | POA: Insufficient documentation

## 2018-02-10 DIAGNOSIS — B356 Tinea cruris: Secondary | ICD-10-CM | POA: Insufficient documentation

## 2018-02-10 MED ORDER — CICLOPIROX OLAMINE 0.77 % EX CREA: TOPICAL_CREAM | Freq: Two times a day (BID) | CUTANEOUS | 0 refills | Status: DC

## 2018-02-10 NOTE — ED Provider Notes (Signed)
[facility] Emergency Department Provider Note  ____________________________________________  Time seen: Approximately 11:01 PM  I have reviewed the triage vital signs and the nursing notes.   HISTORY  Chief Complaint Rash    HPI Gregory Oneal is a 32 y.o. male presents to the emergency department with a rash to the right groin for approximately 2 weeks that is pruritic in nature.  Patient reports that he plays contact sports and wears tight fitting clothing.  He denies fever or chills.  No dysuria. No discharge from the penis.    No past medical history on file.  There are no active problems to display for this patient.   Past Surgical History:  Procedure Laterality Date  . KNEE SURGERY      Prior to Admission medications   Medication Sig Start Date End Date Taking? Authorizing Provider  amoxicillin (AMOXIL) 500 MG capsule Take 1 capsule (500 mg total) by mouth 3 (three) times daily. 06/15/16   Joni Reining, PA-C  amoxicillin (AMOXIL) 500 MG tablet Take 1 tablet (500 mg total) by mouth 3 (three) times daily. 02/10/16   Triplett, Cari B, FNP  ciclopirox (LOPROX) 0.77 % cream Apply topically 2 (two) times daily. 02/10/18   Orvil Feil, PA-C  HYDROcodone-acetaminophen (NORCO/VICODIN) 5-325 MG tablet Take 1 tablet by mouth every 4 (four) hours as needed. 02/10/16   Triplett, Cari B, FNP  ibuprofen (ADVIL,MOTRIN) 800 MG tablet Take 1 tablet (800 mg total) by mouth every 8 (eight) hours as needed. 10/14/15   Rockne Menghini, MD  lidocaine (XYLOCAINE) 2 % solution Use as directed 5 mLs in the mouth or throat every 6 (six) hours as needed for mouth pain. 06/15/16   Joni Reining, PA-C  oxyCODONE-acetaminophen (PERCOCET/ROXICET) 5-325 MG tablet Take 1 tablet by mouth every 4 (four) hours as needed for severe pain. 10/14/15   Rockne Menghini, MD  penicillin v potassium (VEETID) 500 MG tablet Take 1 tablet (500 mg total) by mouth 4 (four) times  daily. 05/19/17   Menshew, Charlesetta Ivory, PA-C    Allergies Patient has no known allergies.  No family history on file.  Social History Social History   Tobacco Use  . Smoking status: Current Some Day Smoker  . Smokeless tobacco: Never Used  Substance Use Topics  . Alcohol use: Yes    Frequency: Never    Comment: sometimes  . Drug use: No    Comment: none in last 90 days     Review of Systems  Constitutional: No fever/chills Eyes: No visual changes. No discharge ENT: No upper respiratory complaints. Cardiovascular: no chest pain. Respiratory: no cough. No SOB. Gastrointestinal: No abdominal pain.  No nausea, no vomiting.  No diarrhea.  No constipation. Musculoskeletal: Negative for musculoskeletal pain. Skin: Patient has rash along groin.  Neurological: Negative for headaches, focal weakness or numbness.   ____________________________________________   PHYSICAL EXAM:  VITAL SIGNS: ED Triage Vitals  Enc Vitals Group     BP 02/10/18 2016 100/74     Pulse Rate 02/10/18 2016 79     Resp 02/10/18 2016 18     Temp 02/10/18 2016 99.4 F (37.4 C)     Temp Source 02/10/18 2016 Oral     SpO2 02/10/18 2016 99 %     Weight 02/10/18 2017 268 lb (121.6 kg)     Height 02/10/18 2017  (1.956 m)     Head Circumference --      Peak Flow --  Pain Score 02/10/18 2017 0     Pain Loc --      Pain Edu? --      Excl. in GC? --      Constitutional: Alert and oriented. Well appearing and in no acute distress. Eyes: Conjunctivae are normal. PERRL. EOMI. Head: Atraumatic. Cardiovascular: Normal rate, regular rhythm. Normal S1 and S2.  Good peripheral circulation. Respiratory: Normal respiratory effort without tachypnea or retractions. Lungs CTAB. Good air entry to the bases with no decreased or absent breath sounds. Musculoskeletal: Full range of motion to all extremities. No gross deformities appreciated. Neurologic:  Normal speech and language. No gross focal  neurologic deficits are appreciated.  Skin: Patient has erythematous rash along proximal thighs with irregular borders.  ____________________________________________   LABS (all labs ordered are listed, but only abnormal results are displayed)  Labs Reviewed - No data to display ____________________________________________  EKG   ____________________________________________  RADIOLOGY   No results found.  ____________________________________________    PROCEDURES  Procedure(s) performed:    Procedures    Medications - No data to display   ____________________________________________   INITIAL IMPRESSION / ASSESSMENT AND PLAN / ED COURSE  Pertinent labs & imaging results that were available during my care of the patient were reviewed by me and considered in my medical decision making (see chart for details).  Review of the [place] CSRS was performed in accordance of the [facility] prior to dispensing any controlled drugs.    Assessment and plan Tinea cruris Patient presents to the emergency department with an erythematous rash of the groin.  Patient was discharged with ciclopirox advised to follow-up with primary care.  All patient questions were answered.    ____________________________________________  FINAL CLINICAL IMPRESSION(S) / ED DIAGNOSES  Final diagnoses:  Tinea cruris      NEW MEDICATIONS STARTED DURING THIS VISIT:  ED Discharge Orders        Ordered    ciclopirox (LOPROX) 0.77 % cream  2 times daily     02/10/18 2139          This chart was dictated using voice recognition software/Dragon. Despite best efforts to proofread, errors can occur which can change the meaning. Any change was purely unintentional.    Orvil Feil, PA-C 02/10/18 2307    Arnaldo Natal, MD 02/11/18 [phone]

## 2018-02-10 NOTE — ED Triage Notes (Signed)
Pt has rash to right groin area for 2 weeks.  Pt has itching.  Pt alert.

## 2018-05-19 ENCOUNTER — Emergency Department
Admission: EM | Admit: 2018-05-19 | Discharge: 2018-05-19 | Disposition: A | Discharge status: Home / Self Care | Payer: Self-pay | Attending: Student in an Organized Health Care Education/Training Program | Admitting: Student in an Organized Health Care Education/Training Program

## 2018-05-19 ENCOUNTER — Encounter: Payer: Self-pay | Admitting: Emergency Medicine

## 2018-05-19 ENCOUNTER — Other Ambulatory Visit: Payer: Self-pay

## 2018-05-19 DIAGNOSIS — Z79899 Other long term (current) drug therapy: Secondary | ICD-10-CM | POA: Insufficient documentation

## 2018-05-19 DIAGNOSIS — F17210 Nicotine dependence, cigarettes, uncomplicated: Secondary | ICD-10-CM | POA: Insufficient documentation

## 2018-05-19 DIAGNOSIS — R369 Urethral discharge, unspecified: Secondary | ICD-10-CM | POA: Insufficient documentation

## 2018-05-19 DIAGNOSIS — R350 Frequency of micturition: Secondary | ICD-10-CM | POA: Insufficient documentation

## 2018-05-19 LAB — URINALYSIS, COMPLETE (UACMP) WITH MICROSCOPIC
BILIRUBIN URINE: NEGATIVE
Bacteria, UA: NONE SEEN
GLUCOSE, UA: NEGATIVE mg/dL
Hgb urine dipstick: NEGATIVE
Ketones, ur: NEGATIVE mg/dL
NITRITE: NEGATIVE
Protein, ur: NEGATIVE mg/dL
SPECIFIC GRAVITY, URINE: 1.02 (ref 1.005–1.030)
Squamous Epithelial / LPF: NONE SEEN (ref 0–5)
pH: 6 (ref 5.0–8.0)

## 2018-05-19 LAB — CHLAMYDIA/NGC RT PCR (ARMC ONLY)
Chlamydia Tr: NOT DETECTED
N gonorrhoeae: NOT DETECTED

## 2018-05-19 MED ORDER — METRONIDAZOLE 500 MG PO TABS
2000.0000 mg | ORAL_TABLET | Freq: Once | ORAL | Status: AC
  Administered 2018-05-19: 2000 mg via ORAL
  Filled 2018-05-19: qty 4

## 2018-05-19 MED ORDER — CEFTRIAXONE SODIUM 250 MG IJ SOLR
250.0000 mg | Freq: Once | INTRAMUSCULAR | Status: AC
  Administered 2018-05-19: 250 mg via INTRAMUSCULAR
  Filled 2018-05-19: qty 250

## 2018-05-19 MED ORDER — AZITHROMYCIN 500 MG PO TABS
1000.0000 mg | ORAL_TABLET | Freq: Once | ORAL | Status: AC
  Administered 2018-05-19: 1000 mg via ORAL
  Filled 2018-05-19: qty 2

## 2018-05-19 NOTE — ED Triage Notes (Signed)
Pt reports that he has been having frequency and urination, denies any pain but seen that he had some discharge and wants to be checked out

## 2018-05-19 NOTE — Discharge Instructions (Signed)
Please follow up with the health department next week if symptoms are not improving. All partners should be tested for STDs. Return to the emergency department for worsening symptoms.

## 2018-05-19 NOTE — ED Provider Notes (Signed)
[facility] Emergency Department Provider Note  ____________________________________________  Time seen: Approximately 9:26 PM  I have reviewed the triage vital signs and the nursing notes.   HISTORY  Chief Complaint SEXUALLY TRANSMITTED DISEASE    HPI Gregory Oneal is a 32 y.o. male that presents emergency department for evaluation of increased frequency of urination for a couple of weeks and a small amount of penile discharge after urination today.  Patient is not aware of any exposure to STD.  No abdominal pain, back pain, hematuria.  History reviewed. No pertinent past medical history.  There are no active problems to display for this patient.   Past Surgical History:  Procedure Laterality Date  . KNEE SURGERY      Prior to Admission medications   Medication Sig Start Date End Date Taking? Authorizing Provider  amoxicillin (AMOXIL) 500 MG capsule Take 1 capsule (500 mg total) by mouth 3 (three) times daily. 06/15/16   Joni Reining, PA-C  amoxicillin (AMOXIL) 500 MG tablet Take 1 tablet (500 mg total) by mouth 3 (three) times daily. 02/10/16   Triplett, Cari B, FNP  ciclopirox (LOPROX) 0.77 % cream Apply topically 2 (two) times daily. 02/10/18   Orvil Feil, PA-C  HYDROcodone-acetaminophen (NORCO/VICODIN) 5-325 MG tablet Take 1 tablet by mouth every 4 (four) hours as needed. 02/10/16   Triplett, Cari B, FNP  ibuprofen (ADVIL,MOTRIN) 800 MG tablet Take 1 tablet (800 mg total) by mouth every 8 (eight) hours as needed. 10/14/15   Rockne Menghini, MD  lidocaine (XYLOCAINE) 2 % solution Use as directed 5 mLs in the mouth or throat every 6 (six) hours as needed for mouth pain. 06/15/16   Joni Reining, PA-C  oxyCODONE-acetaminophen (PERCOCET/ROXICET) 5-325 MG tablet Take 1 tablet by mouth every 4 (four) hours as needed for severe pain. 10/14/15   Rockne Menghini, MD  penicillin v potassium (VEETID) 500 MG tablet Take 1 tablet (500 mg total) by  mouth 4 (four) times daily. 05/19/17   Menshew, Charlesetta Ivory, PA-C    Allergies Patient has no known allergies.  History reviewed. No pertinent family history.  Social History Social History   Tobacco Use  . Smoking status: Current Some Day Smoker  . Smokeless tobacco: Never Used  Substance Use Topics  . Alcohol use: Yes    Frequency: Never    Comment: sometimes  . Drug use: No    Comment: none in last 90 days     Review of Systems  Constitutional: No fever/chills Cardiovascular: No chest pain. Respiratory: No SOB. Gastrointestinal: No abdominal pain.  No nausea, no vomiting.  Musculoskeletal: Negative for musculoskeletal pain. Skin: Negative for rash, abrasions, lacerations, ecchymosis.   ____________________________________________   PHYSICAL EXAM:  VITAL SIGNS: ED Triage Vitals  Enc Vitals Group     BP 05/19/18 1904 (!) 142/76     Pulse Rate 05/19/18 1904 66     Resp 05/19/18 1904 20     Temp 05/19/18 1904 98.5 F (36.9 C)     Temp Source 05/19/18 1904 Oral     SpO2 05/19/18 1904 100 %     Weight 05/19/18 1905 260 lb (117.9 kg)     Height 05/19/18 1905 6\' 5"  (1.956 m)     Head Circumference --      Peak Flow --      Pain Score 05/19/18 1948 0     Pain Loc --      Pain Edu? --  Excl. in GC? --      Constitutional: Alert and oriented. Well appearing and in no acute distress. Eyes: Conjunctivae are normal. PERRL. EOMI. Head: Atraumatic. ENT:      Ears:      Nose: No congestion/rhinnorhea.      Mouth/Throat: Mucous membranes are moist.  Neck: No stridor.   Cardiovascular: Normal rate, regular rhythm.  Good peripheral circulation. Respiratory: Normal respiratory effort without tachypnea or retractions. Lungs CTAB. Good air entry to the bases with no decreased or absent breath sounds. Musculoskeletal: Full range of motion to all extremities. No gross deformities appreciated. Neurologic:  Normal speech and language. No gross focal neurologic  deficits are appreciated.  Skin:  Skin is warm, dry and intact. No rash noted. Psychiatric: Mood and affect are normal. Speech and behavior are normal. Patient exhibits appropriate insight and judgement.   ____________________________________________   LABS (all labs ordered are listed, but only abnormal results are displayed)  Labs Reviewed  URINALYSIS, COMPLETE (UACMP) WITH MICROSCOPIC - Abnormal; Notable for the following components:      Result Value   Color, Urine YELLOW (*)    APPearance CLEAR (*)    Leukocytes, UA TRACE (*)    All other components within normal limits  CHLAMYDIA/NGC RT PCR ([facility] ONLY)   ____________________________________________  EKG   ____________________________________________  RADIOLOGY  No results found.  ____________________________________________    PROCEDURES  Procedure(s) performed:    Procedures    Medications  cefTRIAXone (ROCEPHIN) injection 250 mg (250 mg Intramuscular Given 05/19/18 2138)  azithromycin (ZITHROMAX) tablet 1,000 mg (1,000 mg Oral Given 05/19/18 2138)  metroNIDAZOLE (FLAGYL) tablet 2,000 mg (2,000 mg Oral Given 05/19/18 2138)     ____________________________________________   INITIAL IMPRESSION / ASSESSMENT AND PLAN / ED COURSE  Pertinent labs & imaging results that were available during my care of the patient were reviewed by me and considered in my medical decision making (see chart for details).  Review of the [place] CSRS was performed in accordance of the [facility] prior to dispensing any controlled drugs.     Patient's diagnosis is consistent with pyuria.  Patient will be treated empirically for STDs with IM ceftriaxone, oral azithromycin and oral Flagyl.  He was educated that all partners need to be tested and treated.  Patient is to follow up with health department as directed. Patient is given ED precautions to return to the ED for any worsening or new  symptoms.     ____________________________________________  FINAL CLINICAL IMPRESSION(S) / ED DIAGNOSES  Final diagnoses:  Penile discharge      NEW MEDICATIONS STARTED DURING THIS VISIT:  ED Discharge Orders    None          This chart was dictated using voice recognition software/Dragon. Despite best efforts to proofread, errors can occur which can change the meaning. Any change was purely unintentional.    Enid Derry, PA-C 05/19/18 2254    Willy Eddy, MD 05/19/18 [phone]

## 2018-05-23 ENCOUNTER — Telehealth: Payer: Self-pay | Admitting: Emergency Medicine

## 2018-05-23 NOTE — Telephone Encounter (Signed)
Patient called asking for std test results.  Gave him results.

## 2018-05-24 LAB — HM HIV SCREENING LAB: HM HIV Screening: NEGATIVE

## 2019-01-02 ENCOUNTER — Encounter: Payer: Self-pay | Admitting: Emergency Medicine

## 2019-01-02 ENCOUNTER — Other Ambulatory Visit: Payer: Self-pay

## 2019-01-02 ENCOUNTER — Emergency Department
Admission: EM | Admit: 2019-01-02 | Discharge: 2019-01-02 | Disposition: A | Discharge status: Home / Self Care | Payer: Self-pay | Attending: Emergency Medicine | Admitting: Emergency Medicine

## 2019-01-02 DIAGNOSIS — M5416 Radiculopathy, lumbar region: Secondary | ICD-10-CM | POA: Insufficient documentation

## 2019-01-02 DIAGNOSIS — Z79899 Other long term (current) drug therapy: Secondary | ICD-10-CM | POA: Insufficient documentation

## 2019-01-02 DIAGNOSIS — F17210 Nicotine dependence, cigarettes, uncomplicated: Secondary | ICD-10-CM | POA: Insufficient documentation

## 2019-01-02 MED ORDER — METHOCARBAMOL 500 MG PO TABS: 500.0000 mg | ORAL_TABLET | Freq: Every evening | ORAL | 0 refills | Status: DC | PRN

## 2019-01-02 MED ORDER — MELOXICAM 15 MG PO TABS: 15.0000 mg | ORAL_TABLET | Freq: Every day | ORAL | 0 refills | Status: DC

## 2019-01-02 MED ORDER — KETOROLAC TROMETHAMINE 30 MG/ML IJ SOLN
30.0000 mg | Freq: Once | INTRAMUSCULAR | Status: AC
  Administered 2019-01-02: 30 mg via INTRAMUSCULAR
  Filled 2019-01-02: qty 1

## 2019-01-02 NOTE — ED Provider Notes (Signed)
[facility] Emergency Department Provider Note  ____________________________________________  Time seen: Approximately 6:47 PM  I have reviewed the triage vital signs and the nursing notes.   HISTORY  Chief Complaint Hip Pain    HPI Gregory PattenRobert Picking is a 33 y.o. male who presents the emergency department complaining of left hip/buttock pain that is been radiating down his left leg for approximately 3 weeks.  Patient reports that it is intermittent in nature but has been worsening and becoming more constant over the past several days.  Patient reports that it is a sharp sensation with a burning/tingling sensation running down the left leg.  No known trauma or injury.  Patient denies any back pain.  He is ambulatory with no difficulty.  No history of same in the past.  No bowel bladder dysfunction, saddle anesthesia or paresthesias.  No other complaints at this time.         History reviewed. No pertinent past medical history.  There are no active problems to display for this patient.   Past Surgical History:  Procedure Laterality Date  . KNEE SURGERY      Prior to Admission medications   Medication Sig Start Date End Date Taking? Authorizing Provider  amoxicillin (AMOXIL) 500 MG capsule Take 1 capsule (500 mg total) by mouth 3 (three) times daily. 06/15/16   Joni ReiningSmith, Ronald K, PA-C  amoxicillin (AMOXIL) 500 MG tablet Take 1 tablet (500 mg total) by mouth 3 (three) times daily. 02/10/16   Triplett, Cari B, FNP  ciclopirox (LOPROX) 0.77 % cream Apply topically 2 (two) times daily. 02/10/18   Orvil FeilWoods, Jaclyn M, PA-C  HYDROcodone-acetaminophen (NORCO/VICODIN) 5-325 MG tablet Take 1 tablet by mouth every 4 (four) hours as needed. 02/10/16   Triplett, Cari B, FNP  ibuprofen (ADVIL,MOTRIN) 800 MG tablet Take 1 tablet (800 mg total) by mouth every 8 (eight) hours as needed. 10/14/15   Rockne MenghiniNorman, Anne-Caroline, MD  lidocaine (XYLOCAINE) 2 % solution Use as directed 5 mLs in  the mouth or throat every 6 (six) hours as needed for mouth pain. 06/15/16   Joni ReiningSmith, Ronald K, PA-C  meloxicam (MOBIC) 15 MG tablet Take 1 tablet (15 mg total) by mouth daily. 01/02/19   Cuthriell, Delorise RoyalsJonathan D, PA-C  methocarbamol (ROBAXIN) 500 MG tablet Take 1 tablet (500 mg total) by mouth at bedtime as needed for muscle spasms. 01/02/19   Cuthriell, Delorise RoyalsJonathan D, PA-C  oxyCODONE-acetaminophen (PERCOCET/ROXICET) 5-325 MG tablet Take 1 tablet by mouth every 4 (four) hours as needed for severe pain. 10/14/15   Rockne MenghiniNorman, Anne-Caroline, MD  penicillin v potassium (VEETID) 500 MG tablet Take 1 tablet (500 mg total) by mouth 4 (four) times daily. 05/19/17   Menshew, Charlesetta IvoryJenise V Bacon, PA-C    Allergies Patient has no known allergies.  No family history on file.  Social History Social History   Tobacco Use  . Smoking status: Current Some Day Smoker  . Smokeless tobacco: Never Used  Substance Use Topics  . Alcohol use: Yes    Frequency: Never    Comment: sometimes  . Drug use: No    Comment: none in last 90 days     Review of Systems  Constitutional: No fever/chills Eyes: No visual changes. No discharge ENT: No upper respiratory complaints. Cardiovascular: no chest pain. Respiratory: no cough. No SOB. Gastrointestinal: No abdominal pain.  No nausea, no vomiting.  No diarrhea.  No constipation. Genitourinary: Negative for dysuria. No hematuria Musculoskeletal: Positive for left hip/buttocks pain. Skin: Negative for rash,  abrasions, lacerations, ecchymosis. Neurological: Negative for headaches, focal weakness or numbness. 10-point ROS otherwise negative.  ____________________________________________   PHYSICAL EXAM:  VITAL SIGNS: ED Triage Vitals  Enc Vitals Group     BP 01/02/19 1843 138/87     Pulse Rate 01/02/19 1843 96     Resp 01/02/19 1843 18     Temp 01/02/19 1843 (!) 97.5 F (36.4 C)     Temp Source 01/02/19 1843 Oral     SpO2 01/02/19 1843 100 %     Weight 01/02/19 1842 260  lb (117.9 kg)     Height 01/02/19 1842 6\' 5"  (1.956 m)     Head Circumference --      Peak Flow --      Pain Score 01/02/19 1842 7     Pain Loc --      Pain Edu? --      Excl. in GC? --      Constitutional: Alert and oriented. Well appearing and in no acute distress. Eyes: Conjunctivae are normal. PERRL. EOMI. Head: Atraumatic. Neck: No stridor.    Cardiovascular: Normal rate, regular rhythm. Normal S1 and S2.  Good peripheral circulation. Respiratory: Normal respiratory effort without tachypnea or retractions. Lungs CTAB. Good air entry to the bases with no decreased or absent breath sounds. Musculoskeletal: Full range of motion to all extremities. No gross deformities appreciated.  No gross external findings to the lumbar spine or the left hip.  Full range of motion to the lumbar spine and left hip.  Nontender to palpation midline and bilateral paraspinal muscle groups in the lumbar spine.  Mild tenderness to palpation of the left-sided sciatic notch.  Minimal tenderness to palpation over the left greater trochanteric region.  No other tenderness to palpation.  No palpable abnormality.  Examination of the knee and ankle is unremarkable.  Dorsalis pedis pulse intact distally.  Sensation intact distally.  Negative straight leg raise bilaterally. Neurologic:  Normal speech and language. No gross focal neurologic deficits are appreciated.  Skin:  Skin is warm, dry and intact. No rash noted. Psychiatric: Mood and affect are normal. Speech and behavior are normal. Patient exhibits appropriate insight and judgement.   ____________________________________________   LABS (all labs ordered are listed, but only abnormal results are displayed)  Labs Reviewed - No data to display ____________________________________________  EKG   ____________________________________________  RADIOLOGY   No results found.  ____________________________________________    PROCEDURES  Procedure(s)  performed:    Procedures    Medications  ketorolac (TORADOL) 30 MG/ML injection 30 mg (has no administration in time range)     ____________________________________________   INITIAL IMPRESSION / ASSESSMENT AND PLAN / ED COURSE  Pertinent labs & imaging results that were available during my care of the patient were reviewed by me and considered in my medical decision making (see chart for details).  Review of the [place] CSRS was performed in accordance of the [facility] prior to dispensing any controlled drugs.           Patient's diagnosis is consistent with lumbar radiculopathy.  Patient presents the emergency department complaining of left hip/buttocks pain that does radiate down the left leg.  No known trauma.  Exam was reassuring with no significant acute findings.  No indication for imaging or labs at this time.  Patient will be treated with Toradol in the emergency department.  Meloxicam and Robaxin at home for symptom relief.  Follow-up with primary care or orthopedics as needed..  Patient is given ED  precautions to return to the ED for any worsening or new symptoms.     ____________________________________________  FINAL CLINICAL IMPRESSION(S) / ED DIAGNOSES  Final diagnoses:  Lumbar radiculopathy      NEW MEDICATIONS STARTED DURING THIS VISIT:  ED Discharge Orders         Ordered    meloxicam (MOBIC) 15 MG tablet  Daily     01/02/19 1924    methocarbamol (ROBAXIN) 500 MG tablet  At bedtime PRN     01/02/19 1924              This chart was dictated using voice recognition software/Dragon. Despite best efforts to proofread, errors can occur which can change the meaning. Any change was purely unintentional.    Racheal Patches, PA-C 01/02/19 1924    Minna Antis, MD 01/02/19 2145

## 2019-01-02 NOTE — ED Triage Notes (Signed)
L hip pain x 3 weeks. Denies fall or injury at onset. Does radiate down leg.

## 2019-03-14 ENCOUNTER — Ambulatory Visit: Payer: Self-pay

## 2019-03-14 ENCOUNTER — Other Ambulatory Visit: Payer: Self-pay

## 2019-03-14 DIAGNOSIS — Z113 Encounter for screening for infections with a predominantly sexual mode of transmission: Secondary | ICD-10-CM

## 2019-03-14 DIAGNOSIS — N341 Nonspecific urethritis: Secondary | ICD-10-CM

## 2019-03-14 LAB — GRAM STAIN

## 2019-03-14 MED ORDER — AZITHROMYCIN 500 MG PO TABS
1000.0000 mg | ORAL_TABLET | Freq: Once | ORAL | Status: AC
  Administered 2019-03-14: 1000 mg via ORAL

## 2019-03-14 MED ORDER — AZITHROMYCIN 500 MG PO TABS: ORAL_TABLET | ORAL | 0 refills | Status: DC

## 2019-03-14 MED ORDER — AZITHROMYCIN 500 MG PO TABS: 1000.0000 mg | ORAL_TABLET | Freq: Once | ORAL | Status: DC

## 2019-03-14 MED ORDER — AZITHROMYCIN 500 MG PO TABS: 500.0000 mg | ORAL_TABLET | Freq: Once | ORAL | Status: DC

## 2019-03-14 NOTE — Progress Notes (Signed)
Password [phone].Marland KitchenMarland KitchenMarland Kitchen

## 2019-03-15 NOTE — Progress Notes (Signed)
    STI clinic/screening visit  Subjective:  Gregory Oneal is a 33 y.o. male being seen today for an STI screening visit. The patient reports they do have symptoms.  Patient has the following medical conditionsdoes not have a problem list on file.  Chief Complaint  Patient presents with  . SEXUALLY TRANSMITTED DISEASE    Patient reports  HPI   See flowsheet for further details and programmatic requirements.    The following portions of the patient's history were reviewed and updated as appropriate: allergies, current medications, past family history, past medical history, past social history, past surgical history and problem list. Problem list updated.  Objective:  There were no vitals filed for this visit.  Physical Exam Constitutional:      Appearance: Normal appearance.  HENT:     Mouth/Throat:     Mouth: Mucous membranes are moist.     Pharynx: No oropharyngeal exudate or posterior oropharyngeal erythema.  Neck:     Musculoskeletal: Neck supple.     Comments: No adenopathy Genitourinary:    Penis: Normal.      Scrotum/Testes: Normal.     Comments: Thin clear disch. From urethra;  Testicles- nontender, normal size without mass palpated Skin:    General: Skin is warm.     Findings: No rash.  Neurological:     Mental Status: He is alert.  Psychiatric:        Mood and Affect: Mood normal.       Assessment and Plan:  Jacque Garrels is a 33 y.o. male presenting to the [facility] Department for STI screening  1. Screening for venereal disease Treat client for NGU as per SO.  Discussed that his partner needs to be tested and treated.  Co. No sexual activity until partner is treated.  Recommend condoms with sexual activity. - Gram stain - Gonococcus culture - HIV/HCV [facility] - Syphilis Serology, [facility]  2. NGU (nongonococcal urethritis)  - azithromycin (ZITHROMAX) tablet 1,000 mg     No follow-ups on file.  No future  appointments.  Hassell Done, FNP

## 2019-03-19 LAB — GONOCOCCUS CULTURE

## 2019-03-20 ENCOUNTER — Telehealth: Payer: Self-pay | Admitting: Family Medicine

## 2019-03-20 NOTE — Telephone Encounter (Signed)
Patient scheduled to come in tomorrow  

## 2019-03-21 ENCOUNTER — Other Ambulatory Visit: Payer: Self-pay

## 2019-03-21 ENCOUNTER — Ambulatory Visit: Payer: Self-pay

## 2019-03-21 DIAGNOSIS — N341 Nonspecific urethritis: Secondary | ICD-10-CM

## 2019-03-21 DIAGNOSIS — A64 Unspecified sexually transmitted disease: Secondary | ICD-10-CM

## 2019-03-21 MED ORDER — AZITHROMYCIN 500 MG PO TABS
1000.0000 mg | ORAL_TABLET | Freq: Once | ORAL | Status: AC
  Administered 2019-03-21: 16:00:00 1000 mg via ORAL

## 2019-03-21 NOTE — Progress Notes (Signed)
    STI clinic/screening visit  Subjective:  Gregory Oneal is a 33 y.o. male being seen today for an STI screening visit. The patient reports they do have symptoms.  Patient has the following medical conditionsdoes not have a problem list on file. Client states he was treated 03/14/19 for NGU.  He and partner had sex on 03/20/19-condom broke.  Partner hadn't received treatment for contact to NGU.  Client requests repeat NGU treatment and another gram stain today.. His partner is here today for evaluation and treatment, No chief complaint on file.   Patient reports  HPI see subjective   See flowsheet for further details and programmatic requirements.    The following portions of the patient's history were reviewed and updated as appropriate: allergies, current medications, past family history, past medical history, past social history, past surgical history and problem list. Problem list updated.  Objective:  There were no vitals filed for this visit.  Physical Exam Genitourinary:    Penis: Normal.      Comments: No disch noted   Assessment and Plan:  Gregory Oneal is a 33 y.o. male presenting to the [facility] Department for STI screening  1. STD (male) Re-treatment of NGU- Azithromycin 100 mg po STAT.  Recommend no sexual activity x 1 wk.  Partner here today for treatment.     No follow-ups on file.  No future appointments.  Hassell Done, FNP

## 2019-03-22 LAB — GRAM STAIN

## 2019-05-03 ENCOUNTER — Telehealth: Payer: Self-pay

## 2019-05-03 NOTE — Telephone Encounter (Signed)
TC to patient re: 03/21/19 appt. Patient reports did not have bloodwork during this visit. HIV/RPR ordered via Epic. Will contact provider to remove orders.  Aileen Fass, RN

## 2019-06-01 NOTE — Addendum Note (Signed)
Addended by: Cletis Media on: 06/01/2019 08:46 AM   Modules accepted: Orders

## 2019-08-18 ENCOUNTER — Emergency Department
Admission: EM | Admit: 2019-08-18 | Discharge: 2019-08-18 | Disposition: A | Discharge status: Home / Self Care | Payer: Self-pay | Attending: Emergency Medicine | Admitting: Emergency Medicine

## 2019-08-18 ENCOUNTER — Other Ambulatory Visit: Payer: Self-pay

## 2019-08-18 DIAGNOSIS — Z20828 Contact with and (suspected) exposure to other viral communicable diseases: Secondary | ICD-10-CM | POA: Insufficient documentation

## 2019-08-18 DIAGNOSIS — J069 Acute upper respiratory infection, unspecified: Secondary | ICD-10-CM | POA: Insufficient documentation

## 2019-08-18 DIAGNOSIS — J029 Acute pharyngitis, unspecified: Secondary | ICD-10-CM

## 2019-08-18 DIAGNOSIS — Z720 Tobacco use: Secondary | ICD-10-CM | POA: Insufficient documentation

## 2019-08-18 LAB — GROUP A STREP BY PCR: Group A Strep by PCR: NOT DETECTED

## 2019-08-18 NOTE — ED Notes (Signed)
See triage note  Presents with chills and sore throat  Also has had some cough  sxs' started yesterday unsure of fever but has had chills

## 2019-08-18 NOTE — ED Provider Notes (Signed)
[facility] Emergency Department Provider Note  ____________________________________________   First MD Initiated Contact with Patient 08/18/19 0720     (approximate)  I have reviewed the triage vital signs and the nursing notes.   HISTORY  Chief Complaint Sore Throat   HPI Isaac Dubie is a 33 y.o. male presents to the ED with complaint of sore throat, chills and subjective fever.  Patient states that he had some nonproductive cough that started yesterday.  He denies any known exposure to strep or Covid.  He denies any nausea, vomiting or diarrhea.  He rates his pain as 7 out of 10.     No past medical history on file.  Patient Active Problem List   Diagnosis Date Noted  . NGU (nongonococcal urethritis) 03/21/2019    Past Surgical History:  Procedure Laterality Date  . KNEE SURGERY      Prior to Admission medications   Not on File    Allergies Patient has no known allergies.  No family history on file.  Social History Social History   Tobacco Use  . Smoking status: Current Some Day Smoker  . Smokeless tobacco: Never Used  Substance Use Topics  . Alcohol use: Yes    Frequency: Never    Comment: sometimes  . Drug use: No    Comment: none in last 90 days    Review of Systems Constitutional: Subjective fever/chills Eyes: No visual changes. ENT: Positive sore throat.  Negative for loss of taste or smell. Cardiovascular: Denies chest pain. Respiratory: Denies shortness of breath.  Positive for nonproductive cough. Gastrointestinal: No abdominal pain.  No nausea, no vomiting.  No diarrhea.  Musculoskeletal: Negative for body aches. Skin: Negative for rash. Neurological: Negative for headaches, focal weakness or numbness. ____________________________________________   PHYSICAL EXAM:  VITAL SIGNS: ED Triage Vitals [08/18/19 0642]  Enc Vitals Group     BP 133/82     Pulse Rate 97     Resp 20     Temp 99 F (37.2 C)   Temp Source Oral     SpO2 98 %     Weight 265 lb (120.2 kg)     Height 6\' 5"  (1.956 m)     Head Circumference      Peak Flow      Pain Score 7     Pain Loc      Pain Edu?      Excl. in [place]?    Constitutional: Alert and oriented. Well appearing and in no acute distress. Eyes: Conjunctivae are normal. PERRL. EOMI. Head: Atraumatic. Nose: No congestion/rhinnorhea. Mouth/Throat: Mucous membranes are moist.  Oropharynx non-erythematous.  No tonsillar enlargement and no exudate noted.  Uvula is midline. Neck: No stridor.   Hematological/Lymphatic/Immunilogical: No cervical lymphadenopathy. Cardiovascular: Normal rate, regular rhythm. Grossly normal heart sounds.  Good peripheral circulation. Respiratory: Normal respiratory effort.  No retractions. Lungs CTAB. Musculoskeletal: Moves upper and lower extremities with any difficulty normal gait was noted. Neurologic:  Normal speech and language. No gross focal neurologic deficits are appreciated. No gait instability. Skin:  Skin is warm, dry and intact. No rash noted. Psychiatric: Mood and affect are normal. Speech and behavior are normal.  ____________________________________________   LABS (all labs ordered are listed, but only abnormal results are displayed)  Labs Reviewed  GROUP A STREP BY PCR  NOVEL CORONAVIRUS, NAA (HOSP ORDER, SEND-OUT TO REF LAB; TAT 18-24 HRS)     RADIOLOGY  Official radiology report(s): No results found.  ____________________________________________  PROCEDURES  Procedure(s) performed (including Critical Care):  Procedures  ____________________________________________   INITIAL IMPRESSION / ASSESSMENT AND PLAN / ED COURSE  As part of my medical decision making, I reviewed the following data within the electronic MEDICAL RECORD NUMBER Notes from prior ED visits and [place] Controlled Substance Database  33 year old male presents to the ED with complaint of chills and subjective fever.  He states he had a  sore throat that started yesterday.  He denies any change in taste, smell, nausea, vomiting or diarrhea.  Prior to discharge patient was noted to be coughing frequently in the room.  He was made aware that his strep test was negative.  A Covid test was ordered.  Patient is aware that he needs to quarantine until he is received the results of his test.  If it is positive he will need an additional 10 days at home.  Patient is aware that he needs to increase fluids and take Tylenol or ibuprofen as needed for symptoms.  A work note was written for him.  ____________________________________________   FINAL CLINICAL IMPRESSION(S) / ED DIAGNOSES  Final diagnoses:  Viral URI with cough  Sore throat     ED Discharge Orders    None       Note:  This document was prepared using Dragon voice recognition software and may include unintentional dictation errors.    Tommi Rumps, PA-C 08/18/19 1029    Concha Se, MD 08/19/19 [phone]

## 2019-08-18 NOTE — ED Triage Notes (Signed)
Pt in with co sore throat, chills, and cough since yesterday.

## 2019-08-18 NOTE — Discharge Instructions (Addendum)
Follow-up with your primary care provider if any continued problems.  Your strep test today was negative however a Covid test is being done.  You are to quarantine until you have received the results of this test which usually is within 2 days.  Should your test result as positive you will need an additional 10 days quarantine or until you have been free of fever for 72 hours.  You may take over-the-counter medication for cough because prescription medication also is not very effective for this type of cough.  Increase fluids to stay hydrated.  Tylenol or ibuprofen as needed for throat pain.  Return to the emergency department if any severe worsening of your symptoms such as shortness of breath or difficulty breathing.

## 2019-08-19 LAB — NOVEL CORONAVIRUS, NAA (HOSP ORDER, SEND-OUT TO REF LAB; TAT 18-24 HRS): SARS-CoV-2, NAA: NOT DETECTED

## 2019-08-27 ENCOUNTER — Emergency Department: Payer: Self-pay

## 2019-08-27 ENCOUNTER — Encounter: Payer: Self-pay | Admitting: Emergency Medicine

## 2019-08-27 ENCOUNTER — Emergency Department
Admission: EM | Admit: 2019-08-27 | Discharge: 2019-08-28 | Disposition: A | Discharge status: Home / Self Care | Payer: Self-pay | Attending: Emergency Medicine | Admitting: Emergency Medicine

## 2019-08-27 ENCOUNTER — Other Ambulatory Visit: Payer: Self-pay

## 2019-08-27 DIAGNOSIS — F17210 Nicotine dependence, cigarettes, uncomplicated: Secondary | ICD-10-CM | POA: Insufficient documentation

## 2019-08-27 DIAGNOSIS — J069 Acute upper respiratory infection, unspecified: Secondary | ICD-10-CM | POA: Insufficient documentation

## 2019-08-27 DIAGNOSIS — Z20828 Contact with and (suspected) exposure to other viral communicable diseases: Secondary | ICD-10-CM | POA: Insufficient documentation

## 2019-08-27 MED ORDER — AZITHROMYCIN 500 MG PO TABS
500.0000 mg | ORAL_TABLET | Freq: Once | ORAL | Status: AC
  Administered 2019-08-27: 500 mg via ORAL
  Filled 2019-08-27: qty 1

## 2019-08-27 MED ORDER — PREDNISONE 10 MG PO TABS: ORAL_TABLET | ORAL | 0 refills | Status: DC

## 2019-08-27 MED ORDER — LIDOCAINE VISCOUS HCL 2 % MT SOLN: 10.0000 mL | OROMUCOSAL | 0 refills | Status: DC | PRN

## 2019-08-27 MED ORDER — PREDNISONE 20 MG PO TABS
60.0000 mg | ORAL_TABLET | Freq: Once | ORAL | Status: AC
  Administered 2019-08-27: 60 mg via ORAL
  Filled 2019-08-27: qty 3

## 2019-08-27 MED ORDER — AZITHROMYCIN 250 MG PO TABS: ORAL_TABLET | ORAL | 0 refills | Status: DC

## 2019-08-27 NOTE — Discharge Instructions (Addendum)
Your chest x-ray looks like you may have an infection and may be starting to develop some pneumonia.  Your Covid test should be ready tomorrow.  Please begin antibiotics and steroids tomorrow.  Return to the emergency department for worsening of symptoms.

## 2019-08-27 NOTE — ED Provider Notes (Signed)
[facility] Emergency Department Provider Note  ____________________________________________  Time seen: Approximately 9:41 PM  I have reviewed the triage vital signs and the nursing notes.   HISTORY  Chief Complaint Fever and Diarrhea    HPI Gregory Oneal is a 33 y.o. male that presents to the emergency department for evaluation of nasal congestion, sore throat, cough for 2 weeks.  Patient states that he occasionally has episodes of posttussive emesis. Patient states that he feels like he needs to cough something up but nothing comes up.  Last episode of vomiting was a couple of days ago.  He had 2 episodes of diarrhea this morning. This is improving.  He denies any recent fevers.  Worst symptom is the nasal congestion and his sore throat. He had a negative covid test and strep throat test on the 4th. Patient states that he came to the emergency department today because his job required him to have another Covid test.    No shortness of breath, chest pain, abdominal pain.   History reviewed. No pertinent past medical history.  Patient Active Problem List   Diagnosis Date Noted  . NGU (nongonococcal urethritis) 03/21/2019    Past Surgical History:  Procedure Laterality Date  . KNEE SURGERY      Prior to Admission medications   Medication Sig Start Date End Date Taking? Authorizing Provider  azithromycin (ZITHROMAX Z-PAK) 250 MG tablet Take 2 tablets (500 mg) on  Day 1,  followed by 1 tablet (250 mg) once daily on Days 2 through 5. 08/27/19   Enid Derry, PA-C  lidocaine (XYLOCAINE) 2 % solution Use as directed 10 mLs in the mouth or throat as needed. 08/27/19   Enid Derry, PA-C  predniSONE (DELTASONE) 10 MG tablet Take 6 tablets on day 1, take 5 tablets on day 2, take 4 tablets on day 3, take 3 tablets on day 4, take 2 tablets on day 5, take 1 tablet on day 6 08/27/19   Enid Derry, PA-C    Allergies Patient has no known allergies.  History  reviewed. No pertinent family history.  Social History Social History   Tobacco Use  . Smoking status: Current Some Day Smoker  . Smokeless tobacco: Never Used  Substance Use Topics  . Alcohol use: Yes    Comment: sometimes  . Drug use: No    Comment: none in last 90 days     Review of Systems  Constitutional: No recent fever/chills Eyes: No visual changes. No discharge. ENT: Positive for congestion and rhinorrhea. Positive for nasal congestion. Cardiovascular: No chest pain. Respiratory: Positive for cough. No SOB. Gastrointestinal: No abdominal pain.  Positive for vomiting and diarrhea.  No constipation. Musculoskeletal: Negative for musculoskeletal pain. Skin: Negative for rash, abrasions, lacerations, ecchymosis. Neurological: Positive for headache.   ____________________________________________   PHYSICAL EXAM:  VITAL SIGNS: ED Triage Vitals  Enc Vitals Group     BP 08/27/19 2019 (!) 146/89     Pulse Rate 08/27/19 2019 95     Resp 08/27/19 2019 18     Temp 08/27/19 2019 99.1 F (37.3 C)     Temp Source 08/27/19 2019 Oral     SpO2 08/27/19 2019 100 %     Weight 08/27/19 2020 265 lb (120.2 kg)     Height 08/27/19 2020 6\' 5"  (1.956 m)     Head Circumference --      Peak Flow --      Pain Score 08/27/19 2020 6  Pain Loc --      Pain Edu? --      Excl. in GC? --      Constitutional: Alert and oriented. Well appearing and in no acute distress. Eyes: Conjunctivae are normal. PERRL. EOMI. No discharge. Head: Atraumatic. ENT: No frontal and maxillary sinus tenderness.      Ears: Tympanic membranes pearly gray with good landmarks. No discharge.      Nose: Mild congestion/rhinnorhea.      Mouth/Throat: Mucous membranes are moist. Oropharynx erythematous. Tonsils not enlarged. No exudates. Uvula midline. Neck: No stridor.   Hematological/Lymphatic/Immunilogical: No cervical lymphadenopathy. Cardiovascular: Normal rate, regular rhythm.  Good peripheral  circulation. Respiratory: Normal respiratory effort without tachypnea or retractions. Lungs CTAB. Good air entry to the bases with no decreased or absent breath sounds. Gastrointestinal: Bowel sounds 4 quadrants. Soft and nontender to palpation. No guarding or rigidity. No palpable masses. No distention. Musculoskeletal: Full range of motion to all extremities. No gross deformities appreciated. Neurologic:  Normal speech and language. No gross focal neurologic deficits are appreciated.  Skin:  Skin is warm, dry and intact. No rash noted. Psychiatric: Mood and affect are normal. Speech and behavior are normal. Patient exhibits appropriate insight and judgement.   ____________________________________________   LABS (all labs ordered are listed, but only abnormal results are displayed)  Labs Reviewed  SARS CORONAVIRUS 2 (TAT 6-24 HRS)   ____________________________________________  EKG   ____________________________________________  RADIOLOGY Lexine BatonI, Thimothy Barretta, personally viewed and evaluated these images (plain radiographs) as part of my medical decision making, as well as reviewing the written report by the radiologist.  DG Chest Portable 1 View  Result Date: 08/27/2019 CLINICAL DATA:  Fever, cough, sore throat, nausea, vomiting, diarrhea EXAM: PORTABLE CHEST 1 VIEW COMPARISON:  10/14/2015 FINDINGS: The heart size and mediastinal contours are within normal limits. Mild, diffuse interstitial pulmonary opacity. The visualized skeletal structures are unremarkable. IMPRESSION: Mild, diffuse interstitial pulmonary opacity, which may reflect infection or edema. No focal airspace opacity. Electronically Signed   By: Lauralyn PrimesAlex  Bibbey M.D.   On: 08/27/2019 21:55    ____________________________________________    PROCEDURES  Procedure(s) performed:    Procedures    Medications  predniSONE (DELTASONE) tablet 60 mg (60 mg Oral Given 08/27/19 2238)  azithromycin (ZITHROMAX) tablet 500  mg (500 mg Oral Given 08/27/19 2238)     ____________________________________________   INITIAL IMPRESSION / ASSESSMENT AND PLAN / ED COURSE  Pertinent labs & imaging results that were available during my care of the patient were reviewed by me and considered in my medical decision making (see chart for details).  Review of the [place] CSRS was performed in accordance of the [facility] prior to dispensing any controlled drugs.  Patient's diagnosis is consistent with URI with cough. Vital signs and exam are reassuring.  Chest x-ray concerning with mild diffuse interstitial opacities.  Covid test ordered and is pending.  Patient overall appears very well and nontoxic.  Patient appears well and is staying well hydrated. Patient feels comfortable going home.  He was given a dose of azithromycin and prednisone in the emergency department.  Patient will be discharged home with prescriptions for azithromycin and prednisone. Patient is to follow up with primary care as needed or otherwise directed. Patient is given ED precautions to return to the ED for any worsening or new symptoms.  Gregory Oneal was evaluated in Emergency Department on 08/27/2019 for the symptoms described in the history of present illness. He was evaluated in the context of the  global COVID-19 pandemic, which necessitated consideration that the patient might be at risk for infection with the SARS-CoV-2 virus that causes COVID-19. Institutional protocols and algorithms that pertain to the evaluation of patients at risk for COVID-19 are in a state of rapid change based on information released by regulatory bodies including the CDC and federal and state organizations. These policies and algorithms were followed during the patient's care in the ED.   ____________________________________________  FINAL CLINICAL IMPRESSION(S) / ED DIAGNOSES  Final diagnoses:  URI with cough and congestion      NEW MEDICATIONS STARTED DURING THIS  VISIT:  ED Discharge Orders         Ordered    azithromycin (ZITHROMAX Z-PAK) 250 MG tablet     08/27/19 2311    predniSONE (DELTASONE) 10 MG tablet     08/27/19 2311    lidocaine (XYLOCAINE) 2 % solution  As needed     08/27/19 2312              This chart was dictated using voice recognition software/Dragon. Despite best efforts to proofread, errors can occur which can change the meaning. Any change was purely unintentional.    Laban Emperor, PA-C 08/28/19 0012    Blake Divine, MD 08/28/19 2016

## 2019-08-27 NOTE — ED Triage Notes (Signed)
Pt reports two weeks of fever, cough, sore throat, n/v/diarrhea. Seen here about 2 weeks ago after 3 days of sx and had negative covid test. Still not feeling any better.

## 2019-08-28 LAB — SARS CORONAVIRUS 2 (TAT 6-24 HRS): SARS Coronavirus 2: NEGATIVE

## 2019-12-27 ENCOUNTER — Other Ambulatory Visit: Payer: Self-pay

## 2020-04-22 ENCOUNTER — Emergency Department
Admission: EM | Admit: 2020-04-22 | Discharge: 2020-04-22 | Disposition: A | Discharge status: Home / Self Care | Payer: Self-pay | Attending: Emergency Medicine | Admitting: Emergency Medicine

## 2020-04-22 ENCOUNTER — Encounter: Payer: Self-pay | Admitting: Emergency Medicine

## 2020-04-22 ENCOUNTER — Other Ambulatory Visit: Payer: Self-pay

## 2020-04-22 ENCOUNTER — Emergency Department: Payer: Self-pay

## 2020-04-22 DIAGNOSIS — R05 Cough: Secondary | ICD-10-CM | POA: Insufficient documentation

## 2020-04-22 DIAGNOSIS — Z20822 Contact with and (suspected) exposure to covid-19: Secondary | ICD-10-CM | POA: Insufficient documentation

## 2020-04-22 DIAGNOSIS — R0602 Shortness of breath: Secondary | ICD-10-CM | POA: Insufficient documentation

## 2020-04-22 DIAGNOSIS — J189 Pneumonia, unspecified organism: Secondary | ICD-10-CM | POA: Insufficient documentation

## 2020-04-22 DIAGNOSIS — Z87891 Personal history of nicotine dependence: Secondary | ICD-10-CM | POA: Insufficient documentation

## 2020-04-22 LAB — COMPREHENSIVE METABOLIC PANEL
ALT: 49 U/L — ABNORMAL HIGH (ref 0–44)
AST: 37 U/L (ref 15–41)
Albumin: 4.1 g/dL (ref 3.5–5.0)
Alkaline Phosphatase: 75 U/L (ref 38–126)
Anion gap: 7 (ref 5–15)
BUN: 15 mg/dL (ref 6–20)
CO2: 29 mmol/L (ref 22–32)
Calcium: 9 mg/dL (ref 8.9–10.3)
Chloride: 102 mmol/L (ref 98–111)
Creatinine, Ser: 1.11 mg/dL (ref 0.61–1.24)
GFR calc Af Amer: >60 mL/min (ref >60)
GFR calc non Af Amer: >60 mL/min (ref >60)
Glucose, Bld: 90 mg/dL (ref 70–99)
Potassium: 4.4 mmol/L (ref 3.5–5.1)
Sodium: 138 mmol/L (ref 135–145)
Total Bilirubin: 0.8 mg/dL (ref 0.3–1.2)
Total Protein: 8.4 g/dL — ABNORMAL HIGH (ref 6.5–8.1)

## 2020-04-22 LAB — CBC WITH DIFFERENTIAL/PLATELET
Abs Immature Granulocytes: 0.03 10*3/uL (ref 0.00–0.07)
Basophils Absolute: 0.1 10*3/uL (ref 0.0–0.1)
Basophils Relative: 1 %
Eosinophils Absolute: 0.2 10*3/uL (ref 0.0–0.5)
Eosinophils Relative: 2 %
HCT: 46.5 % (ref 39.0–52.0)
Hemoglobin: 16 g/dL (ref 13.0–17.0)
Immature Granulocytes: 0 %
Lymphocytes Relative: 35 %
Lymphs Abs: 3 10*3/uL (ref 0.7–4.0)
MCH: 31 pg (ref 26.0–34.0)
MCHC: 34.4 g/dL (ref 30.0–36.0)
MCV: 90.1 fL (ref 80.0–100.0)
Monocytes Absolute: 0.7 10*3/uL (ref 0.1–1.0)
Monocytes Relative: 8 %
Neutro Abs: 4.6 10*3/uL (ref 1.7–7.7)
Neutrophils Relative %: 54 %
Platelets: 255 10*3/uL (ref 150–400)
RBC: 5.16 MIL/uL (ref 4.22–5.81)
RDW: 12.8 % (ref 11.5–15.5)
WBC: 8.6 10*3/uL (ref 4.0–10.5)
nRBC: 0 % (ref 0.0–0.2)

## 2020-04-22 LAB — FIBRIN DERIVATIVES D-DIMER (ARMC ONLY): Fibrin derivatives D-dimer (ARMC): 625.54 ng/mL (FEU) — ABNORMAL HIGH (ref 0.00–499.00)

## 2020-04-22 LAB — SARS CORONAVIRUS 2 BY RT PCR (HOSPITAL ORDER, PERFORMED IN ~~LOC~~ HOSPITAL LAB): SARS Coronavirus 2: NEGATIVE

## 2020-04-22 MED ORDER — AZITHROMYCIN 250 MG PO TABS: ORAL_TABLET | ORAL | 0 refills | Status: DC

## 2020-04-22 MED ORDER — IOHEXOL 350 MG/ML SOLN
75.0000 mL | Freq: Once | INTRAVENOUS | Status: AC | PRN
  Administered 2020-04-22: 75 mL via INTRAVENOUS
  Filled 2020-04-22: qty 75

## 2020-04-22 MED ORDER — SODIUM CHLORIDE 0.9 % IV SOLN
1.0000 g | Freq: Once | INTRAVENOUS | Status: AC
  Administered 2020-04-22: 1 g via INTRAVENOUS
  Filled 2020-04-22: qty 10

## 2020-04-22 MED ORDER — BENZONATATE 200 MG PO CAPS: 200.0000 mg | ORAL_CAPSULE | Freq: Three times a day (TID) | ORAL | 0 refills | Status: DC | PRN

## 2020-04-22 NOTE — ED Provider Notes (Signed)
[facility] Emergency Department Provider Note  ____________________________________________   None    (approximate)  I have reviewed the triage vital signs and the nursing notes.   HISTORY  Chief Complaint URI and Cough   HPI Gregory Oneal is a 34 y.o. male presents to the ED with complaint of shortness of breath for [redacted] weeks along with a headache and coughing.  Patient  reports some infrequent vomiting and diarrhea for the last couple of days.  States he began feeling sick after he traveled to [place] for sports 3 weeks ago.  He also has noticed a change in his taste and smell.  He is unaware of any fever but states that he has had chills.  Patient is a cigarette smoker.  No vaccination and no known Covid exposure.  He rates his pain as 6 out of 10.       History reviewed. No pertinent past medical history.  Patient Active Problem List   Diagnosis Date Noted  . NGU (nongonococcal urethritis) 03/21/2019    Past Surgical History:  Procedure Laterality Date  . KNEE SURGERY      Prior to Admission medications   Medication Sig Start Date End Date Taking? Authorizing Provider  azithromycin (ZITHROMAX Z-PAK) 250 MG tablet Take 2 tablets (500 mg) on  Day 1,  followed by 1 tablet (250 mg) once daily on Days 2 through 5. 04/22/20   Tommi Rumps, PA-C  benzonatate (TESSALON) 200 MG capsule Take 1 capsule (200 mg total) by mouth 3 (three) times daily as needed for cough. 04/22/20   Tommi Rumps, PA-C    Allergies Patient has no known allergies.  History reviewed. No pertinent family history.  Social History Social History   Tobacco Use  . Smoking status: Current Some Day Smoker  . Smokeless tobacco: Never Used  Substance Use Topics  . Alcohol use: Yes    Comment: sometimes  . Drug use: No    Comment: none in last 90 days    Review of Systems Constitutional: No fever/chills Eyes: No visual changes. ENT: No sore throat.  Positive  loss of taste and smell. Cardiovascular: Denies chest pain. Respiratory: Positive shortness of breath.  Positive cough. Gastrointestinal: No abdominal pain.  No nausea, positive vomiting.  Positive diarrhea.  Genitourinary: Negative for dysuria. Musculoskeletal: Negative for back pain. Skin: Negative for rash. Neurological: Positive for headache.  No focal weakness or numbness. ____________________________________________   PHYSICAL EXAM:  VITAL SIGNS: ED Triage Vitals  Enc Vitals Group     BP 04/22/20 0740 126/87     Pulse Rate 04/22/20 0740 83     Resp 04/22/20 0740 18     Temp 04/22/20 0740 99.1 F (37.3 C)     Temp Source 04/22/20 0740 Oral     SpO2 04/22/20 0740 100 %     Weight 04/22/20 0737 264 lb 8.8 oz (120 kg)     Height 04/22/20 0737 6\' 5"  (1.956 m)     Head Circumference --      Peak Flow --      Pain Score 04/22/20 0747 6     Pain Loc --      Pain Edu? --      Excl. in GC? --     Constitutional: Alert and oriented. Well appearing and in no acute distress. Eyes: Conjunctivae are normal. PERRL. EOMI. Head: Atraumatic. Nose: No congestion/rhinnorhea. Neck: No stridor.   Cardiovascular: Normal rate, regular rhythm. Grossly normal heart sounds.  Good peripheral circulation. Respiratory: Normal respiratory effort.  No retractions. Lungs CTAB. Gastrointestinal: Soft and nontender. No distention.  Musculoskeletal: Moves upper and lower extremities without any difficulty.  Normal gait was noted. Neurologic:  Normal speech and language. No gross focal neurologic deficits are appreciated.  Skin:  Skin is warm, dry and intact. No rash noted. Psychiatric: Mood and affect are normal. Speech and behavior are normal.  ____________________________________________   LABS (all labs ordered are listed, but only abnormal results are displayed)  Labs Reviewed  COMPREHENSIVE METABOLIC PANEL - Abnormal; Notable for the following components:      Result Value   Total Protein  8.4 (*)    ALT 49 (*)    All other components within normal limits  FIBRIN DERIVATIVES D-DIMER ([facility] ONLY) - Abnormal; Notable for the following components:   Fibrin derivatives D-dimer ([facility]) 625.54 (*)    All other components within normal limits  SARS CORONAVIRUS 2 BY RT PCR (HOSPITAL ORDER, PERFORMED IN [place] [facility] [facility])  CBC WITH DIFFERENTIAL/PLATELET     RADIOLOGY   Official radiology report(s): CT Angio Chest PE W and/or Wo Contrast  Result Date: 04/22/2020 CLINICAL DATA:  Chest pain, shortness of breath, pleurisy. Lingering cough for 2 weeks. EXAM: CT ANGIOGRAPHY CHEST WITH CONTRAST TECHNIQUE: Multidetector CT imaging of the chest was performed using the standard protocol during bolus administration of intravenous contrast. Multiplanar CT image reconstructions and MIPs were obtained to evaluate the vascular anatomy. CONTRAST:  17mL OMNIPAQUE IOHEXOL 350 MG/ML SOLN COMPARISON:  None. FINDINGS: Cardiovascular: Satisfactory opacification of the pulmonary arteries to the segmental level. No evidence of pulmonary embolism. Normal heart size. No pericardial effusion. Mediastinum/Nodes: No enlarged mediastinal, hilar, or axillary lymph nodes. Thyroid gland, trachea, and esophagus demonstrate no significant findings. Lungs/Pleura: No pleural effusion or pneumothorax. Left lower lobe airspace disease Upper Abdomen: No acute abnormality. Musculoskeletal: No chest wall abnormality. No acute or significant osseous findings. Review of the MIP images confirms the above findings. IMPRESSION: 1. No evidence of pulmonary embolus. 2. Left lower lobe pneumonia. Electronically Signed   By: Elige Ko   On: 04/22/2020 13:52    ____________________________________________   PROCEDURES  Procedure(s) performed (including Critical Care):  Procedures   ____________________________________________   INITIAL IMPRESSION / ASSESSMENT AND PLAN / ED COURSE  As part of my medical decision making,  I reviewed the following data within the electronic MEDICAL RECORD NUMBER Notes from prior ED visits and [place] Controlled Substance Database  [name] was evaluated in Emergency Department on 04/22/2020 for the symptoms described in the history of present illness. He was evaluated in the context of the global COVID-19 pandemic, which necessitated consideration that the patient might be at risk for infection with the SARS-CoV-2 virus that causes COVID-19. Institutional protocols and algorithms that pertain to the evaluation of patients at risk for COVID-19 are in a state of rapid change based on information released by regulatory bodies including the CDC and federal and state organizations. These policies and algorithms were followed during the patient's care in the ED.  34 year old male presents to the ED with complaint of shortness of breath for the last 3 weeks.  Patient states that he began feeling sick after he traveled to [place] to play football.  Recently he developed cough, mild vomiting and diarrhea.  He also realized that he had a loss of taste and smell.  Patient is unaware of any known Covid exposure.  Patient smokes cigarettes which is less than half pack a day.  Covid test was negative however D-dimer was elevated.  CT scan shows that patient has left lower lobe pneumonia.  His sports activities were discussed and patient was encouraged to get plenty of rest and at this time restrain from sports.  Rocephin 1 g IV was given to him while in the ED.  A prescription for Zithromax and Tessalon 200 mg [place] was sent to his pharmacy.  Patient is encouraged to return to the emergency department if any severe worsening of his symptoms and to follow-up with his PCP if any continued problems.  ____________________________________________   FINAL CLINICAL IMPRESSION(S) / ED DIAGNOSES  Final diagnoses:  Pneumonia of left lower lobe due to infectious organism     ED Discharge Orders          Ordered    azithromycin (ZITHROMAX Z-PAK) 250 MG tablet     Discontinue  Reprint     04/22/20 1403    benzonatate (TESSALON) 200 MG capsule  3 times daily PRN     Discontinue  Reprint     04/22/20 1403           Note:  This document was prepared using Dragon voice recognition software and may include unintentional dictation errors.    Tommi Rumps, PA-C 04/22/20 1502    Delton Prairie, MD 04/22/20 [phone]

## 2020-04-22 NOTE — ED Triage Notes (Signed)
Patient presents to the ED with a lingering cough x 2 weeks as well as loss of taste and smell.  Patient states he was looking at covid symptoms online and realized he had a lot of the symptoms.

## 2020-04-22 NOTE — Discharge Instructions (Addendum)
Follow-up with your primary care provider if any continued problems or return to the emergency department if any severe worsening of your symptoms such as shortness of breath or difficulty breathing.  CT scan shows you have left lower lobe pneumonia.  Read information about pneumonia.  Get plenty of rest.  Stay hydrated with lots of fluids.  Tylenol or ibuprofen if needed for fever or discomfort.  Take all of antibiotic until completely finished.  Also the [facility] as every 8 hours as needed for cough.  These will not cause any drowsiness and you are safe to drive while taking them.

## 2020-04-22 NOTE — ED Notes (Addendum)
Pt reports SOB x 3 weeks w/ HA and coughing. Pt reports cough and mild vomitting and diarrhea. Pt states began feeling sick after travelling to [place] for sports x 3 weeks ago.

## 2020-07-15 ENCOUNTER — Ambulatory Visit: Payer: Self-pay | Attending: Internal Medicine

## 2020-07-15 DIAGNOSIS — Z23 Encounter for immunization: Secondary | ICD-10-CM

## 2020-07-15 NOTE — Progress Notes (Signed)
   Covid-19 Vaccination Clinic  Name:  Gregory Oneal    MRN: [id number] DOB: Oct 03, 1985  07/15/2020  Gregory Oneal was observed post Covid-19 immunization for 15 minutes without incident. He was provided with Vaccine Information Sheet and instruction to access the V-Safe system.   Gregory Oneal was instructed to call 911 with any severe reactions post vaccine: Marland Kitchen Difficulty breathing  . Swelling of face and throat  . A fast heartbeat  . A bad rash all over body  . Dizziness and weakness   Immunizations Administered    Name Date Dose VIS Date Route   Pfizer COVID-19 Vaccine 07/15/2020  4:27 PM 0.3 mL 07/03/2020 Intramuscular   Manufacturer: [employer], [employer]   Lot: [id number]   NDC: [address]-2

## 2020-08-05 ENCOUNTER — Ambulatory Visit: Payer: Self-pay | Attending: Internal Medicine

## 2020-08-05 DIAGNOSIS — Z23 Encounter for immunization: Secondary | ICD-10-CM

## 2020-08-05 NOTE — Progress Notes (Signed)
   Covid-19 Vaccination Clinic  Name:  Gregory Oneal    MRN: [id number] DOB: [birth year]  08/05/2020  [name] was observed post Covid-19 immunization for 15 minutes without incident. He was provided with Vaccine Information Sheet and instruction to access the V-Safe system.   [name] was instructed to call 911 with any severe reactions post vaccine: Marland Kitchen Difficulty breathing  . Swelling of face and throat  . A fast heartbeat  . A bad rash all over body  . Dizziness and weakness   Immunizations Administered    Name Date Dose VIS Date Route   Pfizer COVID-19 Vaccine 08/05/2020  4:14 PM 0.3 mL 07/03/2020 Intramuscular   Manufacturer: [employer], [employer]   Lot: [id number]   NDC: [address]-1

## 2020-12-18 ENCOUNTER — Other Ambulatory Visit: Payer: Self-pay

## 2020-12-18 ENCOUNTER — Emergency Department
Admission: EM | Admit: 2020-12-18 | Discharge: 2020-12-18 | Disposition: A | Discharge status: Home / Self Care | Payer: Self-pay | Attending: Emergency Medicine | Admitting: Emergency Medicine

## 2020-12-18 ENCOUNTER — Emergency Department: Payer: Self-pay

## 2020-12-18 ENCOUNTER — Encounter: Payer: Self-pay | Admitting: Emergency Medicine

## 2020-12-18 DIAGNOSIS — F17200 Nicotine dependence, unspecified, uncomplicated: Secondary | ICD-10-CM | POA: Insufficient documentation

## 2020-12-18 DIAGNOSIS — R0789 Other chest pain: Secondary | ICD-10-CM | POA: Insufficient documentation

## 2020-12-18 DIAGNOSIS — R059 Cough, unspecified: Secondary | ICD-10-CM

## 2020-12-18 DIAGNOSIS — J069 Acute upper respiratory infection, unspecified: Secondary | ICD-10-CM | POA: Insufficient documentation

## 2020-12-18 MED ORDER — ALBUTEROL SULFATE HFA 108 (90 BASE) MCG/ACT IN AERS
2.0000 | INHALATION_SPRAY | Freq: Once | RESPIRATORY_TRACT | Status: AC
  Administered 2020-12-18: 2 via RESPIRATORY_TRACT
  Filled 2020-12-18: qty 6.7

## 2020-12-18 MED ORDER — DEXAMETHASONE 6 MG PO TABS
10.0000 mg | ORAL_TABLET | Freq: Once | ORAL | Status: AC
  Administered 2020-12-18: 10 mg via ORAL
  Filled 2020-12-18: qty 1

## 2020-12-18 MED ORDER — AZITHROMYCIN 250 MG PO TABS: ORAL_TABLET | ORAL | 0 refills | Status: DC

## 2020-12-18 MED ORDER — BENZONATATE 200 MG PO CAPS: 200.0000 mg | ORAL_CAPSULE | Freq: Three times a day (TID) | ORAL | 0 refills | Status: DC | PRN

## 2020-12-18 NOTE — ED Notes (Signed)
Pt to xray. Ambulatory.

## 2020-12-18 NOTE — ED Triage Notes (Signed)
Patient ambulatory to triage with steady gait, without difficulty or distress noted; pt reports since yesterday prod cough green/yellow sputum, runny nose, diff tasting and left sided CP radiating into back; st hx pneumonia and "feels same"

## 2020-12-18 NOTE — ED Provider Notes (Signed)
[facility] Emergency Department Provider Note  ____________________________________________   Event Date/Time   First MD Initiated Contact with Patient 12/18/20 [phone]     (approximate)  I have reviewed the triage vital signs and the nursing notes.   HISTORY  Chief Complaint Chest Pain and Cough    HPI Gregory Oneal is a 35 y.o. male with remote history of asthma here with cough, sinus congestion, sputum production.  The patient states that for the last 2 days, he has had progressively worsening initially dry and now productive cough, mild shortness of breath, and fatigue.  He has had increasing confusion has had associated nasal congestion and mild sore throat.  He feels like he has lost his sense of taste.  He states that he feels like his chest is tight but denies any significant shortness of breath.  No history of cardiac disease.  He has history of asthma as well as a history of prior bronchitis/pneumonia episodes.  No known sick contacts.  Symptoms are worse with exertion.  No alleviating factors.  No other complaints.  No leg swelling.  No rash.  No abdominal pain, nausea, vomiting.        History reviewed. No pertinent past medical history.  Patient Active Problem List   Diagnosis Date Noted  . NGU (nongonococcal urethritis) 03/21/2019    Past Surgical History:  Procedure Laterality Date  . KNEE SURGERY      Prior to Admission medications   Medication Sig Start Date End Date Taking? Authorizing Provider  azithromycin (ZITHROMAX Z-PAK) 250 MG tablet Take 2 tablets (500 mg) on  Day 1,  followed by 1 tablet (250 mg) once daily on Days 2 through 5. 12/18/20   Shaune Pollack, MD  benzonatate (TESSALON) 200 MG capsule Take 1 capsule (200 mg total) by mouth 3 (three) times daily as needed for cough. 12/18/20   Shaune Pollack, MD    Allergies Patient has no known allergies.  No family history on file.  Social History Social History   Tobacco  Use  . Smoking status: Current Some Day Smoker  . Smokeless tobacco: Never Used  Vaping Use  . Vaping Use: Never used  Substance Use Topics  . Alcohol use: Yes    Comment: sometimes  . Drug use: No    Comment: none in last 90 days    Review of Systems  Review of Systems  Constitutional: Positive for fatigue. Negative for chills and fever.  HENT: Positive for congestion, rhinorrhea and sore throat.   Respiratory: Positive for cough and shortness of breath.   Cardiovascular: Negative for chest pain.  Gastrointestinal: Negative for abdominal pain.  Genitourinary: Negative for flank pain.  Musculoskeletal: Negative for neck pain.  Skin: Negative for rash and wound.  Allergic/Immunologic: Negative for immunocompromised state.  Neurological: Negative for weakness and numbness.  Hematological: Does not bruise/bleed easily.  All other systems reviewed and are negative.    ____________________________________________  PHYSICAL EXAM:      VITAL SIGNS: ED Triage Vitals  Enc Vitals Group     BP 12/18/20 0700 140/76     Pulse Rate 12/18/20 0700 (!) 101     Resp 12/18/20 0700 18     Temp 12/18/20 0700 98.9 F (37.2 C)     Temp Source 12/18/20 0700 Oral     SpO2 12/18/20 0700 92 %     Weight 12/18/20 0656 280 lb (127 kg)     Height 12/18/20 0656 6\' 5"  (1.956 m)  Head Circumference --      Peak Flow --      Pain Score 12/18/20 0656 5     Pain Loc --      Pain Edu? --      Excl. in GC? --      Physical Exam Vitals and nursing note reviewed.  Constitutional:      General: He is not in acute distress.    Appearance: He is well-developed.  HENT:     Head: Normocephalic and atraumatic.     Comments: Moderate posterior pharyngeal erythema without tonsillar swelling or exudates.  No asymmetry.  Uvula is midline. Eyes:     Conjunctiva/sclera: Conjunctivae normal.  Cardiovascular:     Rate and Rhythm: Normal rate and regular rhythm.     Heart sounds: Normal heart sounds.   Pulmonary:     Effort: Pulmonary effort is normal. No respiratory distress.     Breath sounds: Wheezing present.     Comments: Normal breathing, speaking in full sentences.  Scant expiratory wheezes noted. Abdominal:     General: There is no distension.  Musculoskeletal:     Cervical back: Neck supple.  Skin:    General: Skin is warm.     Capillary Refill: Capillary refill takes less than 2 seconds.     Findings: No rash.  Neurological:     Mental Status: He is alert and oriented to person, place, and time.     Motor: No abnormal muscle tone.       ____________________________________________   LABS (all labs ordered are listed, but only abnormal results are displayed)  Labs Reviewed - No data to display  ____________________________________________  EKG: Sinus tachycardia, ventricular rate 111.  PR 154, QRS 86, QTc 462.  No acute ST elevations or depressions.  No acute evidence of acute ischemia or infarct. ________________________________________  RADIOLOGY All imaging, including plain films, CT scans, and ultrasounds, independently reviewed by me, and interpretations confirmed via formal radiology reads.  ED MD interpretation:   Chest x-ray: Clear  Official radiology report(s): DG Chest 2 View  Result Date: 12/18/2020 CLINICAL DATA:  Chest pain. EXAM: CHEST - 2 VIEW COMPARISON:  CT 04/22/2020.  Chest x-ray 08/27/2019. FINDINGS: Mediastinum and hilar structures normal. Heart size normal. Low lung volumes. No focal infiltrate. No pleural effusion or pneumothorax. No acute bony abnormality. IMPRESSION: No acute cardiopulmonary disease. Electronically Signed   By: Maisie Fus  Register   On: 12/18/2020 07:26    ____________________________________________  PROCEDURES   Procedure(s) performed (including Critical Care):  Procedures  ____________________________________________  INITIAL IMPRESSION / MDM / ASSESSMENT AND PLAN / ED COURSE  As part of my medical decision  making, I reviewed the following data within the electronic MEDICAL RECORD NUMBER Nursing notes reviewed and incorporated, Old chart reviewed, Notes from prior ED visits, and [place] Controlled Substance Database       *Gregory Oneal was evaluated in Emergency Department on 12/18/2020 for the symptoms described in the history of present illness. He was evaluated in the context of the global COVID-19 pandemic, which necessitated consideration that the patient might be at risk for infection with the SARS-CoV-2 virus that causes COVID-19. Institutional protocols and algorithms that pertain to the evaluation of patients at risk for COVID-19 are in a state of rapid change based on information released by regulatory bodies including the CDC and federal and state organizations. These policies and algorithms were followed during the patient's care in the ED.  Some ED evaluations and interventions may be  delayed as a result of limited staffing during the pandemic.*     Medical Decision Making: Well-appearing, pleasant 35 year old male here with cough, sputum production, and fatigue.  Suspect atypical pneumonia versus bronchitis with possible component of underlying reactive airway disease.  Patient may also have a concomitant sinusitis given his nasal congestion and drainage.  He is not hypoxic and has normal work of breathing.  He is satting well on room air with no hypoxia.  His mild tachycardia resolved with rest in the room.  EKG is nonischemic.  He has no lower extremity swelling or risk factors or other signs to suggest DVT or PE.  Symptoms are likely infectious in nature.  No signs of ischemia.  Will place on empiric antibiotics, give a dose of Decadron and an albuterol, and advised supportive care at home.  Declined Covid testing.  ____________________________________________  FINAL CLINICAL IMPRESSION(S) / ED DIAGNOSES  Final diagnoses:  Upper respiratory tract infection, unspecified type  Cough  Atypical  chest pain     MEDICATIONS GIVEN DURING THIS VISIT:  Medications  albuterol (VENTOLIN HFA) 108 (90 Base) MCG/ACT inhaler 2 puff (has no administration in time range)  dexamethasone (DECADRON) tablet 10 mg (has no administration in time range)     ED Discharge Orders         Ordered    azithromycin (ZITHROMAX Z-PAK) 250 MG tablet        12/18/20 0741    benzonatate (TESSALON) 200 MG capsule  3 times daily PRN        12/18/20 0741           Note:  This document was prepared using Dragon voice recognition software and may include unintentional dictation errors.   Shaune Pollack, MD 12/18/20 [phone]

## 2020-12-18 NOTE — ED Notes (Signed)
See triage note. Pt to ED c/o nasal congestion, productive cough since yesterday with CP when coughs forcefully.  Has had PNA in past and this "feels the same". Cannot taste anything. Pt in NAD at this time, ambulatory with steady gait.

## 2020-12-18 NOTE — ED Notes (Signed)
Called pharmacy; they will send albuterol inhaler and dexamethasone tablets.

## 2021-01-15 ENCOUNTER — Other Ambulatory Visit: Payer: Self-pay | Admitting: Obstetrics and Gynecology

## 2021-01-15 DIAGNOSIS — N469 Male infertility, unspecified: Secondary | ICD-10-CM

## 2021-01-15 NOTE — Progress Notes (Unsigned)
Gregory Oneal- [facility]

## 2021-08-11 ENCOUNTER — Other Ambulatory Visit: Payer: Self-pay

## 2021-08-11 ENCOUNTER — Emergency Department: Payer: Self-pay

## 2021-08-11 ENCOUNTER — Emergency Department
Admission: EM | Admit: 2021-08-11 | Discharge: 2021-08-11 | Disposition: A | Discharge status: Home / Self Care | Payer: Self-pay | Attending: Emergency Medicine | Admitting: Emergency Medicine

## 2021-08-11 DIAGNOSIS — Z23 Encounter for immunization: Secondary | ICD-10-CM | POA: Insufficient documentation

## 2021-08-11 DIAGNOSIS — M795 Residual foreign body in soft tissue: Secondary | ICD-10-CM

## 2021-08-11 DIAGNOSIS — W34010A Accidental discharge of airgun, initial encounter: Secondary | ICD-10-CM | POA: Insufficient documentation

## 2021-08-11 DIAGNOSIS — F17200 Nicotine dependence, unspecified, uncomplicated: Secondary | ICD-10-CM | POA: Insufficient documentation

## 2021-08-11 DIAGNOSIS — S60457A Superficial foreign body of left little finger, initial encounter: Secondary | ICD-10-CM | POA: Insufficient documentation

## 2021-08-11 MED ORDER — TETANUS-DIPHTHERIA TOXOIDS TD 5-2 LFU IM INJ
0.5000 mL | INJECTION | Freq: Once | INTRAMUSCULAR | Status: DC
  Filled 2021-08-11: qty 0.5

## 2021-08-11 MED ORDER — TETANUS-DIPHTH-ACELL PERTUSSIS 5-2.5-18.5 LF-MCG/0.5 IM SUSY
0.5000 mL | PREFILLED_SYRINGE | Freq: Once | INTRAMUSCULAR | Status: AC
  Administered 2021-08-11: 11:00:00 0.5 mL via INTRAMUSCULAR
  Filled 2021-08-11: qty 0.5

## 2021-08-11 MED ORDER — CEPHALEXIN 500 MG PO CAPS: 500.0000 mg | ORAL_CAPSULE | Freq: Four times a day (QID) | ORAL | 0 refills | Status: AC

## 2021-08-11 MED ORDER — CEPHALEXIN 500 MG PO CAPS
500.0000 mg | ORAL_CAPSULE | Freq: Once | ORAL | Status: AC
  Administered 2021-08-11: 11:00:00 500 mg via ORAL
  Filled 2021-08-11: qty 1

## 2021-08-11 MED ORDER — LIDOCAINE HCL (PF) 1 % IJ SOLN
5.0000 mL | Freq: Once | INTRAMUSCULAR | Status: AC
  Administered 2021-08-11: 11:00:00 5 mL
  Filled 2021-08-11: qty 5

## 2021-08-11 NOTE — ED Notes (Signed)
Pharmacy contacted for missing med 
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
## 2021-08-11 NOTE — Discharge Instructions (Signed)
Please take the antibiotic Keflex 4 times daily for the next 7 days to prevent infection of your hand.  Please return to the emergency department if you develop any redness that is spreading up your finger or pus draining from the wound.
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
## 2022-01-05 IMAGING — CT CT ANGIO CHEST
2 of 6 series · 18 of 46 positions shown · IV contrast (APPLIED)
Comparison: None.

CLINICAL DATA: Chest pain, shortness of breath, pleurisy. Lingering
cough for 2 weeks.

EXAM:
CT ANGIOGRAPHY CHEST WITH CONTRAST
TECHNIQUE: Multidetector CT imaging of the chest was performed using the
standard protocol during bolus administration of intravenous
contrast. Multiplanar CT image reconstructions and MIPs were
obtained to evaluate the vascular anatomy.
CONTRAST:  75mL OMNIPAQUE IOHEXOL 350 MG/ML SOLN

[Series 6: thins · axial · 0.74mm/px · z∈[-322,-55]mm · 15 of 293 slices shown]
[im 13/293  lung]
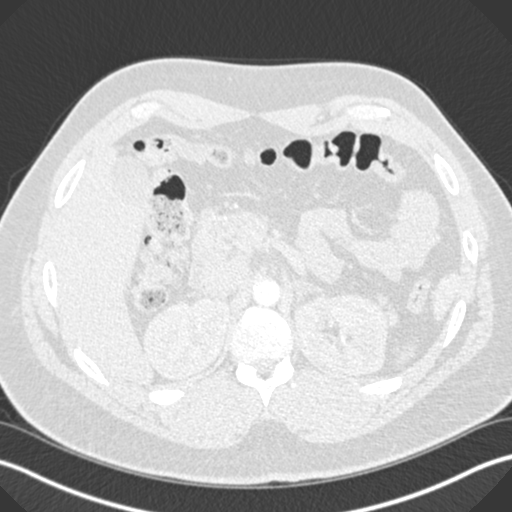
[im 39/293  soft-tissue]
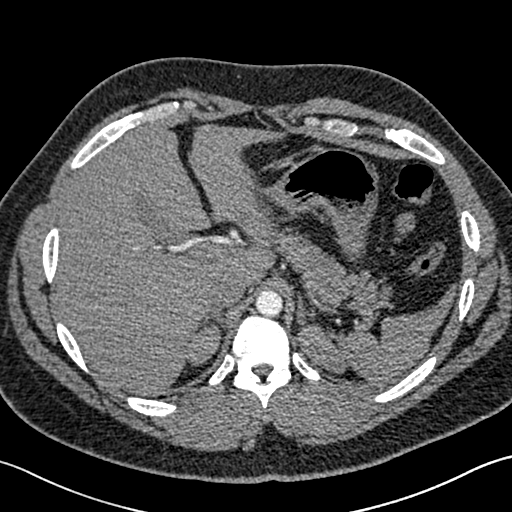
[im 51/293  lung]
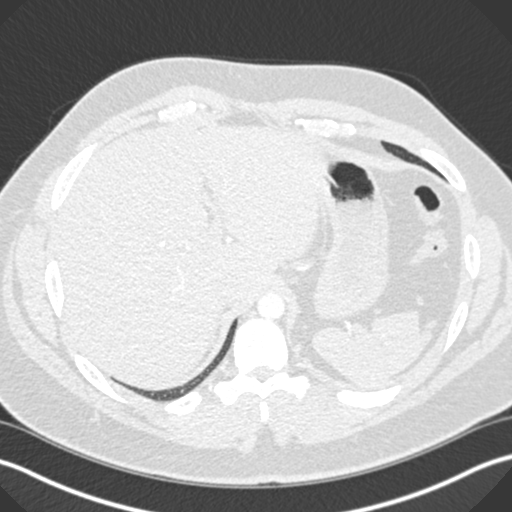
[im 77/293  soft-tissue]
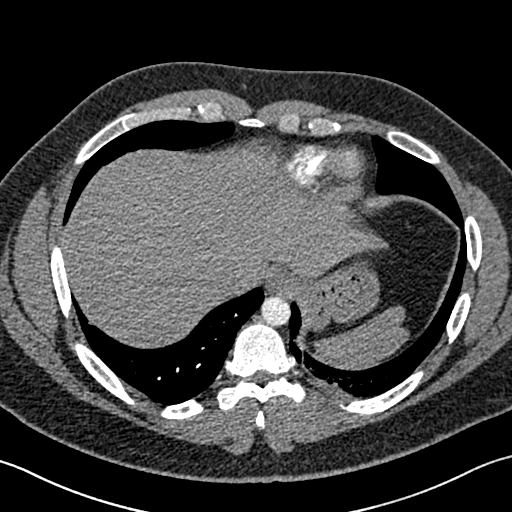
[im 89/293  lung]
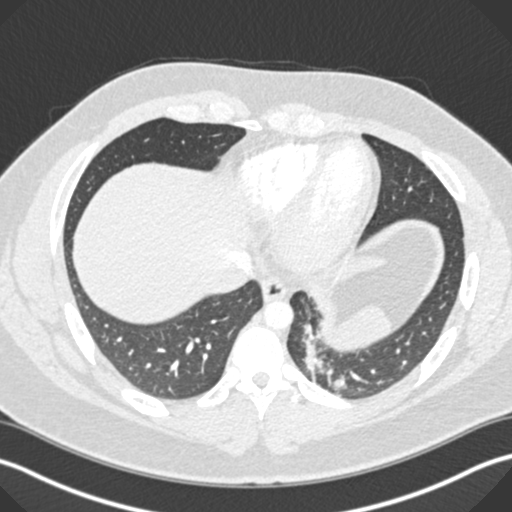
[im 115/293  soft-tissue]
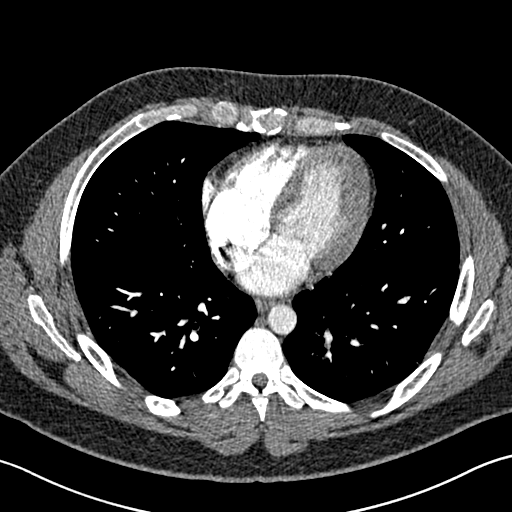
[im 127/293  lung]
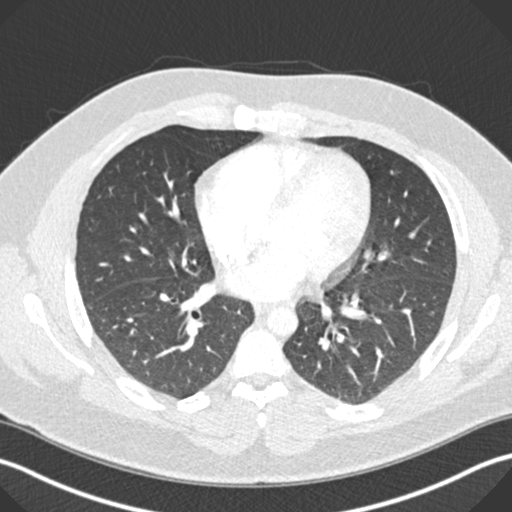
[im 153/293  soft-tissue]
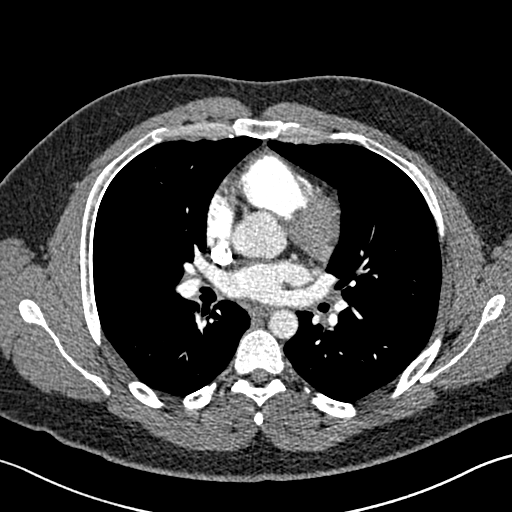
[im 166/293  lung]
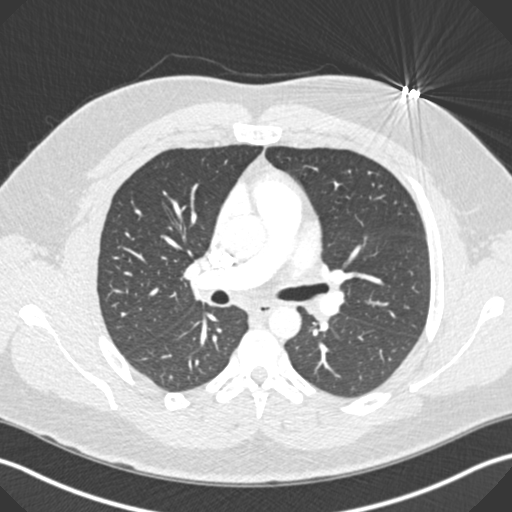
[im 178/293  soft-tissue]
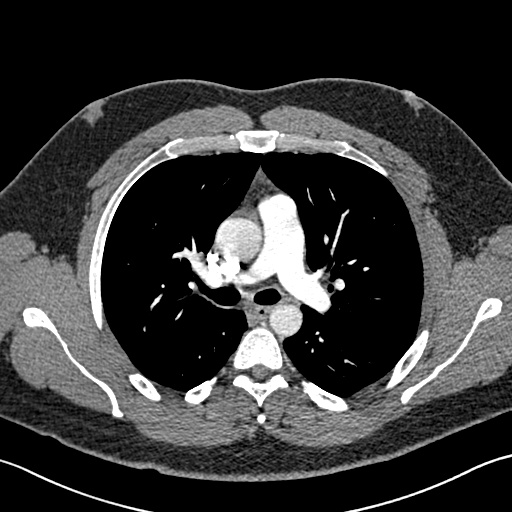
[im 204/293  lung]
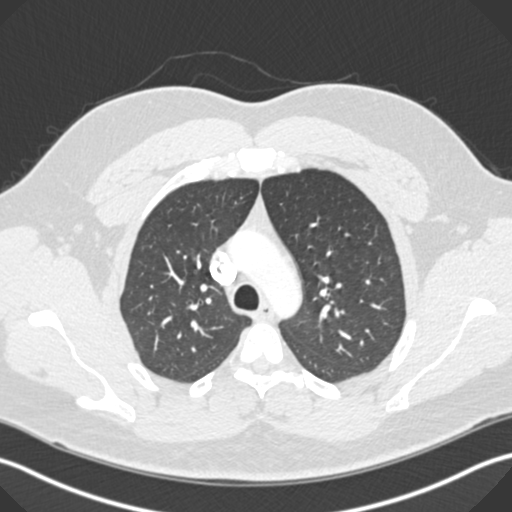
[im 216/293  soft-tissue]
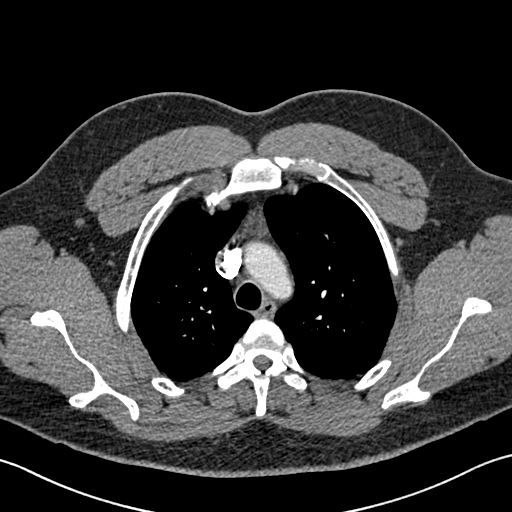
[im 242/293  lung]
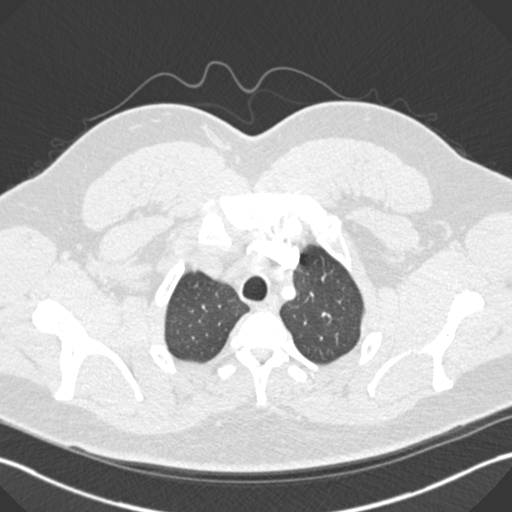
[im 254/293  soft-tissue]
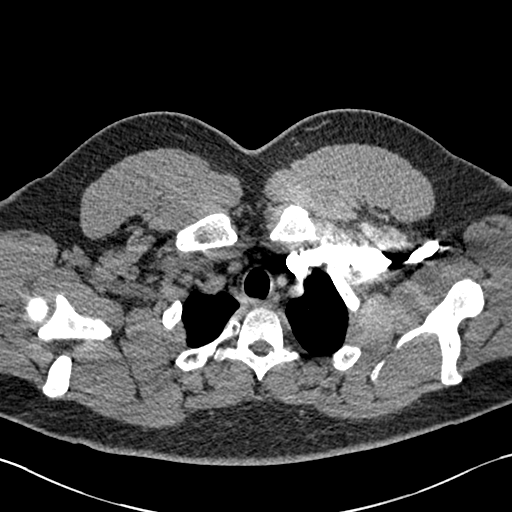
[im 280/293  lung]
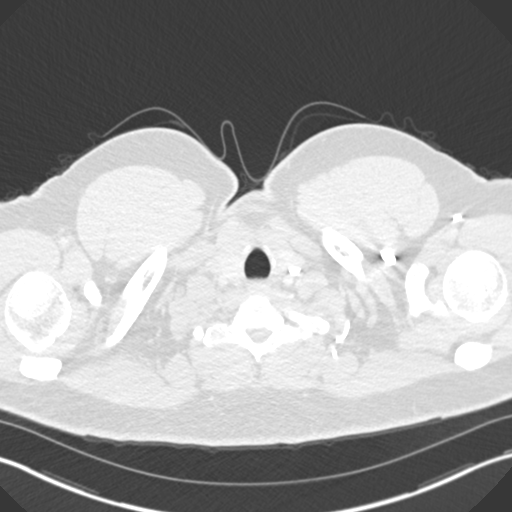

[Series 8: coronal mpr · coronal · 0.59mm/px · 3 of 97 slices shown]
[im 25/97  soft-tissue]
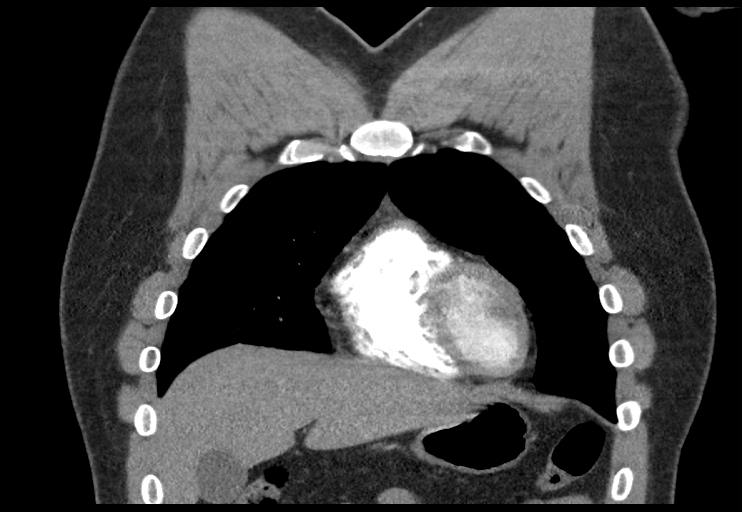
[im 49/97  soft-tissue]
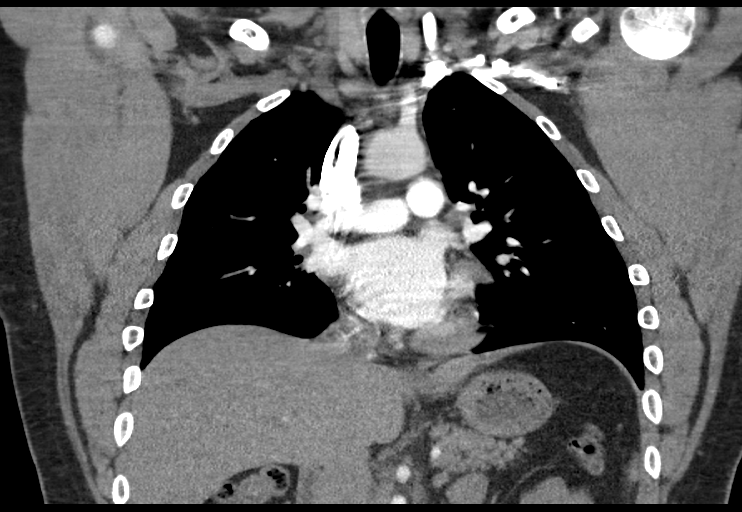
[im 73/97  soft-tissue]
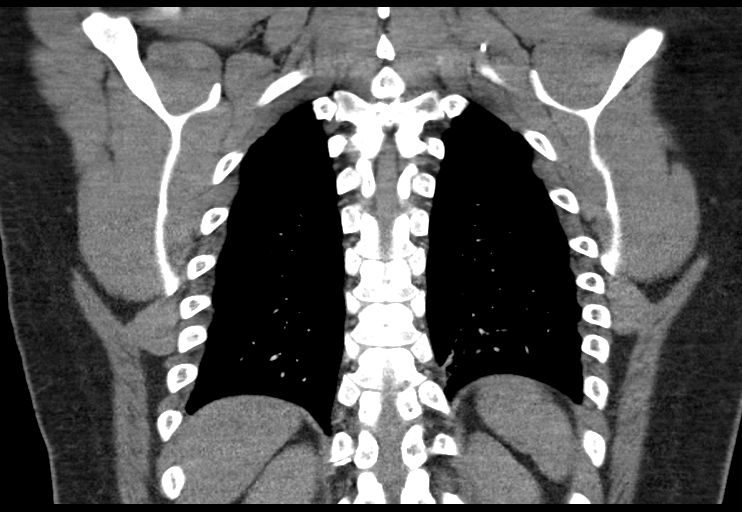

[18 of 46 positions shown; findings below may reference images not displayed]

FINDINGS: Cardiovascular: Satisfactory opacification of the pulmonary arteries
to the segmental level. No evidence of pulmonary embolism. Normal
heart size. No pericardial effusion.

Mediastinum/Nodes: No enlarged mediastinal, hilar, or axillary lymph
nodes. Thyroid gland, trachea, and esophagus demonstrate no
significant findings.

Lungs/Pleura: No pleural effusion or pneumothorax. Left lower lobe
airspace disease

Upper Abdomen: No acute abnormality.

Musculoskeletal: No chest wall abnormality. No acute or significant
osseous findings.

Review of the MIP images confirms the above findings.
IMPRESSION: 1. No evidence of pulmonary embolus.
2. Left lower lobe pneumonia.

## 2022-12-20 ENCOUNTER — Other Ambulatory Visit: Payer: Self-pay

## 2022-12-20 ENCOUNTER — Emergency Department
Admission: EM | Admit: 2022-12-20 | Discharge: 2022-12-20 | Disposition: A | Discharge status: Home / Self Care | Payer: Managed Care, Other (non HMO) | Attending: Emergency Medicine | Admitting: Emergency Medicine

## 2022-12-20 DIAGNOSIS — Z1152 Encounter for screening for COVID-19: Secondary | ICD-10-CM | POA: Insufficient documentation

## 2022-12-20 DIAGNOSIS — J069 Acute upper respiratory infection, unspecified: Secondary | ICD-10-CM | POA: Insufficient documentation

## 2022-12-20 DIAGNOSIS — R0981 Nasal congestion: Secondary | ICD-10-CM | POA: Diagnosis present

## 2022-12-20 LAB — RESP PANEL BY RT-PCR (RSV, FLU A&B, COVID)  RVPGX2
Influenza A by PCR: NEGATIVE
Influenza B by PCR: NEGATIVE
Resp Syncytial Virus by PCR: NEGATIVE
SARS Coronavirus 2 by RT PCR: NEGATIVE

## 2022-12-20 LAB — GROUP A STREP BY PCR: Group A Strep by PCR: NOT DETECTED

## 2022-12-20 MED ORDER — NAPROXEN 500 MG PO TABS
250.0000 mg | ORAL_TABLET | Freq: Once | ORAL | Status: AC
  Administered 2022-12-20: 250 mg via ORAL
  Filled 2022-12-20: qty 1

## 2022-12-20 NOTE — ED Provider Notes (Signed)
[facility] Provider Note    Event Date/Time   First MD Initiated Contact with Patient 12/20/22 0505     (approximate)   History   Sore Throat and Nasal Congestion   HPI  Gregory Oneal is a 37 y.o. male no significant past medical history who presents with congestion and sore throat.  Patient has had symptoms for several days.  Feels like he is congested but nothing comes out when he blows his nose it although his posterior.  He is also having sore throat minimal cough no dyspnea no fever still eating and drinking well no vomiting or diarrhea.     History reviewed. No pertinent past medical history.  Patient Active Problem List   Diagnosis Date Noted   NGU (nongonococcal urethritis) 03/21/2019     Physical Exam  Triage Vital Signs: ED Triage Vitals  Enc Vitals Group     BP 12/20/22 0403 (!) 138/98     Pulse Rate 12/20/22 0403 (!) 103     Resp 12/20/22 0403 18     Temp 12/20/22 0403 98 F (36.7 C)     Temp Source 12/20/22 0403 Oral     SpO2 12/20/22 0403 98 %     Weight 12/20/22 0404 280 lb (127 kg)     Height 12/20/22 0404 6\' 5"  (1.956 m)     Head Circumference --      Peak Flow --      Pain Score 12/20/22 0404 8     Pain Loc --      Pain Edu? --      Excl. in GC? --     Most recent vital signs: Vitals:   12/20/22 0403  BP: (!) 138/98  Pulse: (!) 103  Resp: 18  Temp: 98 F (36.7 C)  SpO2: 98%     General: Awake, no distress.  CV:  Good peripheral perfusion.  Resp:  Normal effort.  Abd:  No distention.  Neuro:             Awake, Alert, Oriented x 3  Other:  No significant erythema of posterior oropharynx no uvula deviation no exudate   ED Results / Procedures / Treatments  Labs (all labs ordered are listed, but only abnormal results are displayed) Labs Reviewed  GROUP A STREP BY PCR  RESP PANEL BY RT-PCR (RSV, FLU A&B, COVID)  [id number]     EKG     RADIOLOGY    PROCEDURES:  Critical Care performed:  No  Procedures    MEDICATIONS ORDERED IN ED: Medications  naproxen (NAPROSYN) tablet 250 mg (has no administration in time range)     IMPRESSION / MDM / ASSESSMENT AND PLAN / ED COURSE  I reviewed the triage vital signs and the nursing notes.                              Patient's presentation is most consistent with acute, uncomplicated illness.  Differential diagnosis includes, but is not limited to, viral URI, allergic postnasal drip, strep pharyngitis  Patient is a 37 year old male presenting with congestion and sore throat times several days.  No significant cough no fevers no GI symptoms.  He is mildly tachycardic arrival vitals otherwise reassuring.  His exam is reassuring there is no significant erythema or exudate in the posterior oropharynx uvula is midline is tolerating secretions and looks well lung sounds are clear.  Suspect viral URI and viral pharyngitis.  COVID flu testing was performed from triage and this is negative strep test is negative.  Discussed NSAIDs for pain and use of OTC decongestants.       FINAL CLINICAL IMPRESSION(S) / ED DIAGNOSES   Final diagnoses:  Viral upper respiratory tract infection     Rx / DC Orders   ED Discharge Orders     None        Note:  This document was prepared using Dragon voice recognition software and may include unintentional dictation errors.   Georga Hacking, MD 12/20/22 [phone]

## 2022-12-20 NOTE — ED Triage Notes (Signed)
Pt to ED via POV c/o sore throat and congestion for about 4 days.  Pt denies CP, SOB

## 2022-12-20 NOTE — Discharge Instructions (Signed)
Your COVID flu RSV and strep test were negative.  You likely have another viral infection that is causing upper respiratory infection with postnasal drip causing your sore throat.  Take the ibuprofen for pain.  You can take 400 mg every 6-8 hours.

## 2022-12-24 ENCOUNTER — Emergency Department
Admission: EM | Admit: 2022-12-24 | Discharge: 2022-12-24 | Disposition: A | Discharge status: Home / Self Care | Payer: Managed Care, Other (non HMO) | Attending: Emergency Medicine | Admitting: Emergency Medicine

## 2022-12-24 ENCOUNTER — Encounter: Payer: Self-pay | Admitting: Emergency Medicine

## 2022-12-24 DIAGNOSIS — J029 Acute pharyngitis, unspecified: Secondary | ICD-10-CM | POA: Insufficient documentation

## 2022-12-24 LAB — GROUP A STREP BY PCR: Group A Strep by PCR: NOT DETECTED

## 2022-12-24 MED ORDER — AMOXICILLIN-POT CLAVULANATE 875-125 MG PO TABS: 1.0000 | ORAL_TABLET | Freq: Two times a day (BID) | ORAL | 0 refills | Status: AC

## 2022-12-24 MED ORDER — AMOXICILLIN-POT CLAVULANATE 875-125 MG PO TABS
1.0000 | ORAL_TABLET | Freq: Once | ORAL | Status: AC
  Administered 2022-12-24: 1 via ORAL
  Filled 2022-12-24: qty 1

## 2022-12-24 MED ORDER — PREDNISONE 10 MG PO TABS: 10.0000 mg | ORAL_TABLET | Freq: Every day | ORAL | 0 refills | Status: DC

## 2022-12-24 MED ORDER — PREDNISONE 20 MG PO TABS
60.0000 mg | ORAL_TABLET | Freq: Once | ORAL | Status: DC
  Filled 2022-12-24: qty 3

## 2022-12-24 NOTE — ED Provider Notes (Signed)
[name] Provider Note   CSN: [id number] Arrival date & time: 12/24/22  2229     History  Chief Complaint  Patient presents with   Sore Throat    Gregory Oneal is a 37 y.o. male.  Presents to the emergency department valuation of sore throat.  Has had sore throat for 1 to 2 weeks.  He describes increased pain and discomfort with swallowing.  Initially was treated for viral pharyngitis.  He denies any coughing, chest pain or shortness of breath.  He is tolerating p.o. well but does have pain with swallowing.  No trismus or drooling.  He has felt warm but no definite fevers no abdominal pain, nausea vomiting or diarrhea  HPI     Home Medications Prior to Admission medications   Medication Sig Start Date End Date Taking? Authorizing Provider  amoxicillin-clavulanate (AUGMENTIN) 875-125 MG tablet Take 1 tablet by mouth 2 (two) times daily for 10 days. 12/24/22 01/03/23 Yes Evon SlackGaines, Amaurie Wandel C, PA-C  predniSONE (DELTASONE) 10 MG tablet Take 1 tablet (10 mg total) by mouth daily. 6,5,4,3,2,1 six day taper 12/24/22  Yes Evon SlackGaines, Andrienne Havener C, PA-C  azithromycin (ZITHROMAX Z-PAK) 250 MG tablet Take 2 tablets (500 mg) on  Day 1,  followed by 1 tablet (250 mg) once daily on Days 2 through 5. 12/18/20   Shaune PollackIsaacs, Cameron, MD  benzonatate (TESSALON) 200 MG capsule Take 1 capsule (200 mg total) by mouth 3 (three) times daily as needed for cough. 12/18/20   Shaune PollackIsaacs, Cameron, MD      Allergies    Patient has no known allergies.    Review of Systems   Review of Systems  Physical Exam Updated Vital Signs BP (!) 142/94 (BP Location: Left Arm)   Pulse 99   Temp 98 F (36.7 C) (Oral)   Resp 18   Ht 6\' 5"  (1.956 m)   Wt 128 kg   SpO2 98%   BMI 33.46 kg/m  Physical Exam Constitutional:      Appearance: He is well-developed.  HENT:     Head: Normocephalic and atraumatic.     Right Ear: Tympanic membrane, ear canal and external ear normal.     Left Ear:  Tympanic membrane, ear canal and external ear normal.     Mouth/Throat:     Mouth: Mucous membranes are moist.     Pharynx: Posterior oropharyngeal erythema present. No oropharyngeal exudate.     Comments: Uvula is midline.  No peritonsillar abscess. Eyes:     Conjunctiva/sclera: Conjunctivae normal.  Cardiovascular:     Rate and Rhythm: Normal rate and regular rhythm.  Pulmonary:     Effort: Pulmonary effort is normal. No respiratory distress.  Abdominal:     General: There is no distension.     Tenderness: There is no abdominal tenderness. There is no guarding.  Musculoskeletal:        General: Normal range of motion.     Cervical back: Normal range of motion.  Skin:    General: Skin is warm.     Findings: No rash.  Neurological:     Mental Status: He is alert and oriented to person, place, and time.  Psychiatric:        Behavior: Behavior normal.        Thought Content: Thought content normal.     ED Results / Procedures / Treatments   Labs (all labs ordered are listed, but only abnormal results are displayed) Labs Reviewed  GROUP  A STREP BY PCR    EKG None  Radiology No results found.  Procedures Procedures    Medications Ordered in ED Medications  amoxicillin-clavulanate (AUGMENTIN) 875-125 MG per tablet 1 tablet (has no administration in time range)  predniSONE (DELTASONE) tablet 60 mg (has no administration in time range)    ED Course/ Medical Decision Making/ A&P                             Medical Decision Making Risk Prescription drug management.  37 year old male with pharyngitis.  Due to erythema and persistent sore throat with no other viral symptoms will treat for strep.  He is also placed on prednisone to help with pain/inflammation.  He will use Tylenol as needed for additional pain relief.  Patient currently afebrile with normal vital signs.  He has no signs of deep space infection or abscess on physical exam.  No trismus, drooling and is  tolerating p.o. well.  He understands signs and symptoms return to the ER for. Final Clinical Impression(s) / ED Diagnoses Final diagnoses:  Sore throat    Rx / DC Orders ED Discharge Orders          Ordered    amoxicillin-clavulanate (AUGMENTIN) 875-125 MG tablet  2 times daily        12/24/22 2302    predniSONE (DELTASONE) 10 MG tablet  Daily        12/24/22 2302              Evon Slack, PA-C 12/24/22 2307    Dionne Bucy, MD 12/25/22 1512

## 2022-12-24 NOTE — Discharge Instructions (Signed)
Please drink lots of foods.  Alternate Tylenol and ibuprofen as needed.  Take prednisone as prescribed.  Take Augmentin as prescribed for 10 days.  Return to the ER for any difficulty swallowing, fevers, worsening symptoms or any urgent changes in your health

## 2022-12-24 NOTE — ED Triage Notes (Signed)
Pt presents ambulatory to triage via POV with complaints of sore throat for the last two weeks. Pt was seen here last week for same and no improvement in his sx. Acute change of L ear pain and swelling of his uvula. Airway patent - respirations equal and unlabored. A&Ox4 at this time. Denies CP or SOB.

## 2023-01-08 ENCOUNTER — Telehealth: Payer: Managed Care, Other (non HMO) | Admitting: Physician Assistant

## 2023-01-08 DIAGNOSIS — J4541 Moderate persistent asthma with (acute) exacerbation: Secondary | ICD-10-CM

## 2023-01-08 MED ORDER — ALBUTEROL SULFATE HFA 108 (90 BASE) MCG/ACT IN AERS
1.0000 | INHALATION_SPRAY | Freq: Four times a day (QID) | RESPIRATORY_TRACT | 0 refills | Status: AC | PRN
Start: 2023-01-08 — End: ?

## 2023-01-08 MED ORDER — PREDNISONE 20 MG PO TABS: 40.0000 mg | ORAL_TABLET | Freq: Every day | ORAL | 0 refills | Status: DC

## 2023-01-08 NOTE — Addendum Note (Signed)
Addended by: Margaretann Loveless on: 01/08/2023 07:46 AM   Modules accepted: Orders

## 2023-01-08 NOTE — Progress Notes (Signed)
E-Visit for Asthma  Based on what you have shared with me, it looks like you may have a flare up of your asthma.  Asthma is a chronic (ongoing) lung disease which results in airway obstruction, inflammation and hyper-responsiveness.   Asthma symptoms vary from person to person, with common symptoms including nighttime awakening and decreased ability to participate in normal activities as a result of shortness of breath. It is often triggered by changes in weather, changes in the season, changes in air temperature, or inside (home, school, daycare or work) allergens such as animal dander, mold, mildew, woodstoves or cockroaches.   It can also be triggered by hormonal changes, extreme emotion, physical exertion or an upper respiratory tract illness.     It is important to identify the trigger, and then eliminate or avoid the trigger if possible.   If you have been prescribed medications to be taken on a regular basis, it is important to follow the asthma action plan and to follow guidelines to adjust medication in response to increasing symptoms of decreased peak expiratory flow rate  Treatment: I have prescribed: Prednisone 40mg by mouth per day for 5 - 7 days  HOME CARE Only take medications as instructed by your medical team. Consider wearing a mask or scarf to improve breathing air temperature have been shown to decrease irritation and decrease exacerbations Get rest. Taking a steamy shower or using a humidifier may help nasal congestion sand ease sore throat pain. You can place a towel over your head and breathe in the steam from hot water coming from a faucet. Using a saline nasal spray works much the same way.  Cough drops, hare candies and sore throat lozenges may ease your cough.  Avoid close contacts especially the very you and the elderly Cover your mouth if you cough or  sneeze Always remember to wash your hands.    GET HELP RIGHT AWAY IF: You develop worsening symptoms; breathlessness at rest, drowsy, confused or agitated, unable to speak in full sentences You have coughing fits You develop a severe headache or visual changes You develop shortness of breath, difficulty breathing or start having chest pain Your symptoms persist after you have completed your treatment plan If your symptoms do not improve within 10 days  MAKE SURE YOU Understand these instructions. Will watch your condition. Will get help right away if you are not doing well or get worse.   Your e-visit answers were reviewed by a board certified advanced clinical practitioner to complete your personal care plan, Depending upon the condition, your plan could have included both over the counter or prescription medications.   Please review your pharmacy choice. Your safety is important to us. If you have drug allergies check your prescription carefully.  You can use MyChart to ask questions about today's visit, request a non-urgent  call back, or ask for a work or school excuse for 24 hours related to this e-Visit. If it has been greater than 24 hours you will need to follow up with your provider, or enter a new e-Visit to address those concerns.   You will get an e-mail in the next two days asking about your experience. I hope that your e-visit has been valuable and will speed your recovery. Thank you for using e-visits.  I have spent 5 minutes in review of e-visit questionnaire, review and updating patient chart, medical decision making and response to patient.   Fredick Schlosser M Saumya Hukill, PA-C  

## 2023-04-13 ENCOUNTER — Emergency Department
Admission: EM | Admit: 2023-04-13 | Discharge: 2023-04-13 | Disposition: A | Discharge status: Home / Self Care | Payer: Managed Care, Other (non HMO) | Attending: Emergency Medicine | Admitting: Emergency Medicine

## 2023-04-13 ENCOUNTER — Encounter: Payer: Self-pay | Admitting: Emergency Medicine

## 2023-04-13 ENCOUNTER — Emergency Department: Payer: Managed Care, Other (non HMO)

## 2023-04-13 ENCOUNTER — Other Ambulatory Visit: Payer: Self-pay

## 2023-04-13 ENCOUNTER — Ambulatory Visit: Payer: Self-pay | Admitting: *Deleted

## 2023-04-13 DIAGNOSIS — M25562 Pain in left knee: Secondary | ICD-10-CM | POA: Insufficient documentation

## 2023-04-13 MED ORDER — NAPROXEN 500 MG PO TABS: 500.0000 mg | ORAL_TABLET | Freq: Two times a day (BID) | ORAL | 0 refills | Status: AC

## 2023-04-13 NOTE — ED Provider Notes (Signed)
[facility] Provider Note    Event Date/Time   First MD Initiated Contact with Patient 04/13/23 1054     (approximate)   History   Knee Pain   HPI  Gregory Oneal is a 37 y.o. male who presents today for evaluation of left-sided knee pain that has been intermittent for 6 to 12 months.  Patient reports that he is still able to play basketball and football.  He denies any acute injury.  He reports that he feels a popping and clicking occasionally, sometimes he feels like his knee gets stuck and he cannot fully extend it.  He reports that he has been able to walk without difficulty.  Has not noticed any redness or swelling to the area.  Patient Active Problem List   Diagnosis Date Noted   NGU (nongonococcal urethritis) 03/21/2019          Physical Exam   Triage Vital Signs: ED Triage Vitals  Encounter Vitals Group     BP 04/13/23 1034 (!) 155/96     Systolic BP Percentile --      Diastolic BP Percentile --      Pulse Rate 04/13/23 1032 (!) 113     Resp 04/13/23 1032 20     Temp 04/13/23 1032 98.5 F (36.9 C)     Temp src --      SpO2 04/13/23 1032 99 %     Weight 04/13/23 1033 282 lb 3 oz (128 kg)     Height 04/13/23 1033 6\' 5"  (1.956 m)     Head Circumference --      Peak Flow --      Pain Score 04/13/23 1033 7     Pain Loc --      Pain Education --      Exclude from Growth Chart --     Most recent vital signs: Vitals:   04/13/23 1032 04/13/23 1034  BP:  (!) 155/96  Pulse: (!) 113   Resp: 20   Temp: 98.5 F (36.9 C)   SpO2: 99%     Physical Exam Vitals and nursing note reviewed.  Constitutional:      General: Awake and alert. No acute distress.    Appearance: Normal appearance. The patient is obese.  HENT:     Head: Normocephalic and atraumatic.     Mouth: Mucous membranes are moist.  Eyes:     General: PERRL. Normal EOMs        Right eye: No discharge.        Left eye: No discharge.     Conjunctiva/sclera:  Conjunctivae normal.  Cardiovascular:     Rate and Rhythm: Normal rate and regular rhythm.     Pulses: Normal pulses.     Heart sounds: Normal heart sounds Pulmonary:     Effort: Pulmonary effort is normal. No respiratory distress.     Breath sounds: Normal breath sounds.  Abdominal:     Abdomen is soft. There is no abdominal tenderness. No rebound or guarding. No distention. Musculoskeletal:        General: No swelling. Normal range of motion.     Cervical back: Normal range of motion and neck supple.  Left knee: No deformity or rash.  Mild tenderness over Gerdy's tubercle.  Mild inferior lateral joint line tenderness. No patellar tenderness, no ballotment Warm and well perfused extremity with 2+ pedal pulses 5/5 strength to dorsiflexion and plantarflexion at the ankle with intact sensation throughout extremity Normal range of  motion of the knee, with intact flexion and extension to active and passive range of motion. Extensor mechanism intact. No ligamentous laxity. Negative anterior/posterior drawer/negative lachman, mild discomfort with mcmurrays No effusion or warmth Intact quadriceps, hamstring function, patellar tendon function Pelvis stable Full ROM of ankle without pain or swelling Foot warm and well perfused Skin:    General: Skin is warm and dry.     Capillary Refill: Capillary refill takes less than 2 seconds.     Findings: No rash.  Neurological:     Mental Status: The patient is awake and alert.      ED Results / Procedures / Treatments   Labs (all labs ordered are listed, but only abnormal results are displayed) Labs Reviewed - No data to display   EKG     RADIOLOGY I independently reviewed and interpreted imaging and agree with radiologists findings.     PROCEDURES:  Critical Care performed:   Procedures   MEDICATIONS ORDERED IN ED: Medications - No data to display   IMPRESSION / MDM / ASSESSMENT AND PLAN / ED COURSE  I reviewed the triage  vital signs and the nursing notes.   Differential diagnosis includes, but is not limited to, IT band syndrome, meniscus injury, knee sprain, osseous abnormality.  Patient is awake and alert hemodynamically stable and afebrile.  He has full normal range of motion of his knee with active and passive range of motion.  No evidence of neurological deficit or vascular compromise on exam. No fracture/dislocation on X-Ray. No deformity or obvious ligamentous laxity on exam.No constitutional symptoms or effusion to suggest septic joint. No history of immunosuppression. Overall well appearing, vital signs stable. No indication for diagnostic or therapeutic procedure such as arthrocentesis.  Patient does describe popping and clicking and occasional catching, suspicious for possible meniscus injury.  He has tenderness over Gerty's tubercle, possibly IT band syndrome.  He was given an Ace wrap for extra support, and prescribed naproxen.  Advise follow-up with orthopedics and he was given the appropriate follow-up information.  Return precautions and care instructions discussed. Outpatient follow-up advised. Patient agrees with plan of care.    Patient's presentation is most consistent with acute complicated illness / injury requiring diagnostic workup.    FINAL CLINICAL IMPRESSION(S) / ED DIAGNOSES   Final diagnoses:  Acute pain of left knee     Rx / DC Orders   ED Discharge Orders          Ordered    naproxen (NAPROSYN) 500 MG tablet  2 times daily with meals        04/13/23 1138             Note:  This document was prepared using Dragon voice recognition software and may include unintentional dictation errors.   Keturah Shavers 04/13/23 1320    Jene Every, MD 04/15/23 [phone]

## 2023-04-13 NOTE — Telephone Encounter (Signed)
left knee pain and swelling   Pt stated he is experiencing left knee pain and swelling and stated it has been going on for a while; however, this past week the pain has become more severe.   Pt is scheduled at [facility] on 06/10/23 and is seeking clinical advice.     Chief Complaint: Knee pain Symptoms: Left knee pain and swelling x 1 year, worsening past week.6-7/10, sharp, shooting at times. Mild swelling at kneecap Frequency: 1 year, worsening pat week. Pertinent Negatives: Patient denies warmth Disposition: [] ED /[] Urgent Care (no appt availability in office) / [] Appointment(In office/virtual)/ []  [facility]/ [] Home Care/ [] Refused Recommended Disposition /[x] [facility]/ []  Follow-up with PCP Additional Notes: No PCP. Established care at [facility] in Sept. [facility], states will be going to [facility] in [place]. Care advise provided, verbalizes understanding.   Reason for Disposition  [1] MODERATE pain (e.g., interferes with normal activities, limping) AND [2] present > 3 days  Answer Assessment - Initial Assessment Questions 1. LOCATION and RADIATION: "Where is the pain located?"      Left knee, outside 2. QUALITY: "What does the pain feel like?"  (e.g., sharp, dull, aching, burning)     Sharp pain, then gone 3. SEVERITY: "How bad is the pain?" "What does it keep you from doing?"   (Scale 1-10; or mild, moderate, severe)   -  MILD (1-3): doesn't interfere with normal activities    -  MODERATE (4-7): interferes with normal activities (e.g., work or school) or awakens from sleep, limping    -  SEVERE (8-10): excruciating pain, unable to do any normal activities, unable to walk     6-7/10 4. ONSET: "When did the pain start?" "Does it come and go, or is it there all the time?"     Last year, worsening past week 5. RECURRENT: "Have you had this pain before?" If Yes, ask: "When, and what happened then?"     No 6. SETTING: "Has there been any recent work,  exercise or other activity that involved that part of the body?"      None 7. AGGRAVATING FACTORS: "What makes the knee pain worse?" (e.g., walking, climbing stairs, running)     Movement 8. ASSOCIATED SYMPTOMS: "Is there any swelling or redness of the knee?"     Swelling at bottom of knee cap. "Egg" 9. OTHER SYMPTOMS: "Do you have any other symptoms?" (e.g., chest pain, difficulty breathing, fever, calf pain)     Worse with certain movements, bending  Protocols used: Knee Pain-A-AH

## 2023-04-13 NOTE — Discharge Instructions (Signed)
Your x-ray does not show any broken bones or large joint effusion.  Please follow-up with orthopedics.  Please wear the Ace wrap during the day and remove it at night.  Please take the medication as prescribed.  It was a pleasure caring for you today.

## 2023-04-13 NOTE — ED Triage Notes (Addendum)
Pt here with left knee pain. Pt states his knee has been giving his problems for the past 6 months. Pt states it constantly hurts, pain is worse on the outside of his knee. Pt denies fall or injury. Pt ambulatory to triage.

## 2023-04-24 NOTE — Progress Notes (Unsigned)
New patient visit  Patient: Gregory Oneal   DOB: 25-Feb-1986   37 y.o. Male  MRN: [id number] Visit Date: 04/29/2023  Today's healthcare provider: Debera Lat, PA-C   No chief complaint on file.  Subjective    Gregory Oneal is a 37 y.o. male who presents today as a new patient to establish care.  HPI  *** Discussed the use of AI scribe software for clinical note transcription with the patient, who gave verbal consent to proceed.  History of Present Illness            No past medical history on file. Past Surgical History:  Procedure Laterality Date   KNEE SURGERY     No family status information on file.   No family history on file. Social History   Socioeconomic History   Marital status: Single    Spouse name: Not on file   Number of children: Not on file   Years of education: Not on file   Highest education level: Not on file  Occupational History   Not on file  Tobacco Use   Smoking status: Some Days   Smokeless tobacco: Never  Vaping Use   Vaping status: Never Used  Substance and Sexual Activity   Alcohol use: Yes    Comment: sometimes   Drug use: No    Comment: none in last 90 days   Sexual activity: Yes  Other Topics Concern   Not on file  Social History Narrative   Not on file   Social Determinants of Health   Financial Resource Strain: Not on file  Food Insecurity: Not on file  Transportation Needs: Not on file  Physical Activity: Not on file  Stress: Not on file  Social Connections: Not on file   Outpatient Medications Prior to Visit  Medication Sig   albuterol (VENTOLIN HFA) 108 (90 Base) MCG/ACT inhaler Inhale 1-2 puffs into the lungs every 6 (six) hours as needed.   No facility-administered medications prior to visit.   No Known Allergies  Immunization History  Administered Date(s) Administered   PFIZER(Purple Top)SARS-COV-2 Vaccination 07/15/2020, 08/05/2020   Tdap 08/11/2021    Health Maintenance  Topic Date Due    Hepatitis C Screening  Never done   COVID-19 Vaccine (3 - 2023-24 season) 05/15/2022   INFLUENZA VACCINE  04/15/2023   DTaP/Tdap/Td (2 - Td or Tdap) 08/12/2031   HIV Screening  Completed   HPV VACCINES  Aged Out    Patient Care Team: Patient, No Pcp Per as PCP - General (General Practice)  Review of Systems  All other systems reviewed and are negative.  Except see HPI   {Insert previous labs (optional):23779} {See past labs  Heme  Chem  Endocrine  Serology  Results Review (optional):1}   Objective    There were no vitals taken for this visit. {Insert last BP/Wt (optional):23777}{See vitals history (optional):1}   Physical Exam Vitals reviewed.  Constitutional:      General: He is not in acute distress.    Appearance: Normal appearance. He is not diaphoretic.  HENT:     Head: Normocephalic and atraumatic.  Eyes:     General: No scleral icterus.    Conjunctiva/sclera: Conjunctivae normal.  Cardiovascular:     Rate and Rhythm: Normal rate and regular rhythm.     Pulses: Normal pulses.     Heart sounds: Normal heart sounds. No murmur heard. Pulmonary:     Effort: Pulmonary effort is normal. No respiratory distress.     Breath  sounds: Normal breath sounds. No wheezing or rhonchi.  Musculoskeletal:     Cervical back: Neck supple.     Right lower leg: No edema.     Left lower leg: No edema.  Lymphadenopathy:     Cervical: No cervical adenopathy.  Skin:    General: Skin is warm and dry.     Findings: No rash.  Neurological:     Mental Status: He is alert and oriented to person, place, and time. Mental status is at baseline.  Psychiatric:        Mood and Affect: Mood normal.        Behavior: Behavior normal.     Depression Screen     No data to display         No results found for any visits on 04/29/23.  Assessment & Plan     ***   Encounter to establish care Welcomed to our clinic Reviewed past medical hx, social hx, family hx and surgical  hx Pt advised to send all vaccination records or screening   No follow-ups on file.    The patient was advised to call back or seek an in-person evaluation if the symptoms worsen or if the condition fails to improve as anticipated.  I discussed the assessment and treatment plan with the patient. The patient was provided an opportunity to ask questions and all were answered. The patient agreed with the plan and demonstrated an understanding of the instructions.  I, Debera Lat, PA-C have reviewed all documentation for this visit. The documentation on  04/29/23  for the exam, diagnosis, procedures, and orders are all accurate and complete.  Debera Lat, [facility], MMS  [facility] [phone] (phone) [phone] (fax)  [facility]

## 2023-04-26 ENCOUNTER — Encounter: Payer: Self-pay | Admitting: Physician Assistant
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
## 2023-04-29 ENCOUNTER — Encounter: Payer: Self-pay | Admitting: Physician Assistant

## 2023-04-29 ENCOUNTER — Ambulatory Visit: Payer: Managed Care, Other (non HMO) | Admitting: Physician Assistant

## 2023-04-29 VITALS — BP 148/97 | HR 103 | Ht 77.0 in | Wt 342.1 lb

## 2023-04-29 DIAGNOSIS — Z1159 Encounter for screening for other viral diseases: Secondary | ICD-10-CM

## 2023-04-29 DIAGNOSIS — F17200 Nicotine dependence, unspecified, uncomplicated: Secondary | ICD-10-CM | POA: Diagnosis not present

## 2023-04-29 DIAGNOSIS — M25562 Pain in left knee: Secondary | ICD-10-CM | POA: Diagnosis not present

## 2023-04-29 DIAGNOSIS — Z7689 Persons encountering health services in other specified circumstances: Secondary | ICD-10-CM

## 2023-04-29 DIAGNOSIS — G8929 Other chronic pain: Secondary | ICD-10-CM

## 2023-05-07 ENCOUNTER — Ambulatory Visit: Payer: Managed Care, Other (non HMO) | Admitting: Physician Assistant

## 2023-05-07 VITALS — BP 149/102 | HR 96 | Temp 98.3°F | Ht 77.0 in | Wt 338.2 lb

## 2023-05-07 DIAGNOSIS — F17200 Nicotine dependence, unspecified, uncomplicated: Secondary | ICD-10-CM

## 2023-05-07 DIAGNOSIS — I10 Essential (primary) hypertension: Secondary | ICD-10-CM | POA: Diagnosis not present

## 2023-05-07 DIAGNOSIS — M25562 Pain in left knee: Secondary | ICD-10-CM | POA: Diagnosis not present

## 2023-05-07 MED ORDER — AMLODIPINE BESYLATE 5 MG PO TABS: 5.0000 mg | ORAL_TABLET | Freq: Every day | ORAL | 0 refills | Status: AC

## 2023-05-07 NOTE — Progress Notes (Unsigned)
Established patient visit  Patient: Gregory Oneal   DOB: Apr 17, 1986   37 y.o. Male  MRN: [id number] Visit Date: 05/07/2023  Today's healthcare provider: Debera Lat, PA-C   No chief complaint on file.  Subjective    HPI  *** Discussed the use of AI scribe software for clinical note transcription with the patient, who gave verbal consent to proceed.  History of Present Illness   The patient, with a history of hypertension and obesity, presents with persistent knee pain. The pain is described as sharp and burning, and is present even when the patient is at rest. The pain is located inside the knee and is not associated with any visible swelling. The patient has been using a work brace and has tried elevating the knee and using compression, but these measures have not alleviated the pain. The patient also reports that the knee often pops. The patient is a smoker and is planning to start a diet.           05/07/2023   10:54 AM  Depression screen PHQ 2/9  Decreased Interest 0  Down, Depressed, Hopeless 0  PHQ - 2 Score 0      05/07/2023   10:55 AM  GAD 7 : Generalized Anxiety Score  Nervous, Anxious, on Edge 0  Control/stop worrying 0  Worry too much - different things 0  Trouble relaxing 0  Restless 0  Easily annoyed or irritable 0  Afraid - awful might happen 0  Total GAD 7 Score 0    Medications: Outpatient Medications Prior to Visit  Medication Sig   albuterol (VENTOLIN HFA) 108 (90 Base) MCG/ACT inhaler Inhale 1-2 puffs into the lungs every 6 (six) hours as needed.   No facility-administered medications prior to visit.    Review of Systems Except see HPI   {Insert previous labs (optional):23779} {See past labs  Heme  Chem  Endocrine  Serology  Results Review (optional):1}   Objective    BP (!) 149/102 (BP Location: Right Arm, Patient Position: Sitting, Cuff Size: Large)   Pulse 96   Temp 98.3 F (36.8 C)   Ht 6\' 5"  (1.956 m)   Wt (!) 338 lb 3.2  oz (153.4 kg)   SpO2 99%   BMI 40.10 kg/m  {Insert last BP/Wt (optional):23777}{See vitals history (optional):1}   Physical Exam   No results found for any visits on 05/07/23.  Assessment & Plan    *** Assessment and Plan    Knee Pain Severe pain in the knee, described as burning and sharp. X-ray shows early signs of osteoarthritis. Pain is not relieved by rest, elevation, or over-the-counter pain medications. -Refer to physical therapy for guided exercises and pain management. -Consider use of Voltaren cream, Biofreeze, or Tiger Balm for topical pain relief. -Continue use of Tylenol for pain, add ibuprofen as needed for breakthrough pain. -Consider imaging if pain persists or worsens.  Obesity Contributing to knee pain and hypertension. Patient is currently working on weight loss. -Refer to a medical weight loss program, if covered by insurance. -Encourage low-impact exercises such as swimming. -Encourage continued efforts to lose weight, aiming for a loss of 1 pound per week.  Hypertension Blood pressure is elevated, likely contributing to overall health issues. -Start antihypertensive medication (2.5 mcg). -Monitor blood pressure at home, recording readings twice daily. -Follow-up appointment in six weeks to assess response to medication.  Tobacco Use Patient is a current smoker. -Encourage smoking cessation as this can contribute to hypertension and overall poor  health.         Return in about 6 weeks (around 06/18/2023) for BP f/u.      [facility]

## 2023-05-08 LAB — HEPATITIS C ANTIBODY: Hep C Virus Ab: NONREACTIVE

## 2023-05-09 DIAGNOSIS — I10 Essential (primary) hypertension: Secondary | ICD-10-CM | POA: Insufficient documentation

## 2023-05-13 ENCOUNTER — Ambulatory Visit: Payer: Managed Care, Other (non HMO) | Admitting: Physician Assistant

## 2023-05-20 ENCOUNTER — Ambulatory Visit: Payer: Managed Care, Other (non HMO)

## 2023-06-02 ENCOUNTER — Encounter: Payer: Self-pay | Admitting: Pharmacist

## 2023-06-10 ENCOUNTER — Ambulatory Visit: Payer: Self-pay | Admitting: Physician Assistant

## 2023-06-11 ENCOUNTER — Ambulatory Visit: Payer: Managed Care, Other (non HMO) | Admitting: Physician Assistant

## 2023-06-30 ENCOUNTER — Ambulatory Visit: Payer: Managed Care, Other (non HMO) | Admitting: Physician Assistant

## 2023-07-22 ENCOUNTER — Ambulatory Visit: Payer: Managed Care, Other (non HMO) | Admitting: Physician Assistant

## 2023-08-06 ENCOUNTER — Ambulatory Visit: Payer: Managed Care, Other (non HMO) | Admitting: Physician Assistant

## 2023-08-22 ENCOUNTER — Other Ambulatory Visit: Payer: Self-pay

## 2023-08-22 ENCOUNTER — Emergency Department: Payer: Managed Care, Other (non HMO)

## 2023-08-22 ENCOUNTER — Emergency Department
Admission: EM | Admit: 2023-08-22 | Discharge: 2023-08-22 | Disposition: A | Discharge status: Home / Self Care | Payer: Managed Care, Other (non HMO) | Attending: Emergency Medicine | Admitting: Emergency Medicine

## 2023-08-22 DIAGNOSIS — Y9389 Activity, other specified: Secondary | ICD-10-CM | POA: Diagnosis not present

## 2023-08-22 DIAGNOSIS — M7052 Other bursitis of knee, left knee: Secondary | ICD-10-CM | POA: Diagnosis not present

## 2023-08-22 DIAGNOSIS — M25562 Pain in left knee: Secondary | ICD-10-CM | POA: Diagnosis present

## 2023-08-22 MED ORDER — HYDROCODONE-ACETAMINOPHEN 5-325 MG PO TABS
1.0000 | ORAL_TABLET | Freq: Once | ORAL | Status: AC
  Administered 2023-08-22: 1 via ORAL
  Filled 2023-08-22: qty 1

## 2023-08-22 MED ORDER — NAPROXEN 500 MG PO TBEC: 500.0000 mg | DELAYED_RELEASE_TABLET | Freq: Two times a day (BID) | ORAL | 0 refills | Status: AC

## 2023-08-22 MED ORDER — MELOXICAM 15 MG PO TABS: 15.0000 mg | ORAL_TABLET | Freq: Every day | ORAL | 0 refills | Status: AC

## 2023-08-22 NOTE — Discharge Instructions (Signed)
Please make an appointment with your PCP, ask your PCP for a f referral to orthopedics.  Please take your pain medicine after meals.  Use the knee brace.  Can use cold pack in the knee for 20 minutes.  You can apply Voltaren gel on your knee.  Please come back to ED or see your PCP if  you have new symptoms like fever.

## 2023-08-22 NOTE — Progress Notes (Unsigned)
  Established patient visit  Patient: Gregory Oneal   DOB: 02-Nov-1985   37 y.o. Male  MRN: [id number] Visit Date: 08/26/2023  Today's healthcare provider: Debera Lat, PA-C   No chief complaint on file.  Subjective    HPI  *** Discussed the use of AI scribe software for clinical note transcription with the patient, who gave verbal consent to proceed.  History of Present Illness               05/07/2023   10:54 AM  Depression screen PHQ 2/9  Decreased Interest 0  Down, Depressed, Hopeless 0  PHQ - 2 Score 0      05/07/2023   10:55 AM  GAD 7 : Generalized Anxiety Score  Nervous, Anxious, on Edge 0  Control/stop worrying 0  Worry too much - different things 0  Trouble relaxing 0  Restless 0  Easily annoyed or irritable 0  Afraid - awful might happen 0  Total GAD 7 Score 0    Medications: Outpatient Medications Prior to Visit  Medication Sig  . albuterol (VENTOLIN HFA) 108 (90 Base) MCG/ACT inhaler Inhale 1-2 puffs into the lungs every 6 (six) hours as needed.  Marland Kitchen amLODipine (NORVASC) 5 MG tablet Take 1 tablet (5 mg total) by mouth daily.   No facility-administered medications prior to visit.    Review of Systems  All other systems reviewed and are negative. Except see HPI   {Insert previous labs (optional):23779} {See past labs  Heme  Chem  Endocrine  Serology  Results Review (optional):1}   Objective    There were no vitals taken for this visit. {Insert last BP/Wt (optional):23777}{See vitals history (optional):1}   Physical Exam Vitals reviewed.  Constitutional:      General: He is not in acute distress.    Appearance: Normal appearance. He is not diaphoretic.  HENT:     Head: Normocephalic and atraumatic.  Eyes:     General: No scleral icterus.    Conjunctiva/sclera: Conjunctivae normal.  Cardiovascular:     Rate and Rhythm: Normal rate and regular rhythm.     Pulses: Normal pulses.     Heart sounds: Normal heart sounds. No murmur  heard. Pulmonary:     Effort: Pulmonary effort is normal. No respiratory distress.     Breath sounds: Normal breath sounds. No wheezing or rhonchi.  Musculoskeletal:     Cervical back: Neck supple.     Right lower leg: No edema.     Left lower leg: No edema.  Lymphadenopathy:     Cervical: No cervical adenopathy.  Skin:    General: Skin is warm and dry.     Findings: No rash.  Neurological:     Mental Status: He is alert and oriented to person, place, and time. Mental status is at baseline.  Psychiatric:        Mood and Affect: Mood normal.        Behavior: Behavior normal.     No results found for any visits on 08/26/23.  Assessment & Plan    *** Assessment and Plan              No follow-ups on file.      [facility]

## 2023-08-22 NOTE — ED Provider Notes (Signed)
[facility] Provider Note    Event Date/Time   First MD Initiated Contact with Patient 08/22/23 1038     (approximate)   History   Knee Pain   HPI  Gregory Oneal is a 37 y.o. male presents today with knee pain in the last 3 days, located in the external side of the knee, pain is constant,  is a burning sensation, and limits fully flexion of  the knee.  Pain does not radiate.  patient took ibuprofen 800 mg every 6 hours  and applied cold and warm pack without relief.  Patient denies fever and any other symptoms.   In July 2024 had similar episode but it was less painful.       Physical Exam   Triage Vital Signs: ED Triage Vitals  Encounter Vitals Group     BP 08/22/23 1015 (!) 130/106     Systolic BP Percentile --      Diastolic BP Percentile --      Pulse Rate 08/22/23 1015 (!) 106     Resp 08/22/23 1015 20     Temp 08/22/23 1015 98 F (36.7 C)     Temp Source 08/22/23 1015 Oral     SpO2 08/22/23 1015 96 %     Weight 08/22/23 1013 270 lb (122.5 kg)     Height 08/22/23 1013 6\' 5"  (1.956 m)     Head Circumference --      Peak Flow --      Pain Score 08/22/23 1013 8     Pain Loc --      Pain Education --      Exclude from Growth Chart --     Most recent vital signs: Vitals:   08/22/23 1015  BP: (!) 130/106  Pulse: (!) 106  Resp: 20  Temp: 98 F (36.7 C)  SpO2: 96%     General: Awake, no distress.  CV:  Good peripheral perfusion.  Resp:  Normal effort.  Abd:  No distention.  LLE: Skin is intact, no warm.  Left knee presents swelling in the lateral area.  Side of the knee is tender to palpation reproducing the same pain that the patient is describing.  Anterior draw negative, posterior draw negative strength and sensation within normal limits.  Pulses present.  No signs of infection.  ED Results / Procedures / Treatments   Labs (all labs ordered are listed, but only abnormal results are displayed) Labs Reviewed - No data to  display   EKG     RADIOLOGY I independently reviewed and interpreted imaging and agree with radiologists findings.  Radiology report no evidence of fracture, dislocation or joint effusion.    PROCEDURES:  Critical Care performed:   Procedures   MEDICATIONS ORDERED IN ED: Medications  HYDROcodone-acetaminophen (NORCO/VICODIN) 5-325 MG per tablet 1 tablet (1 tablet Oral Given 08/22/23 1236)     IMPRESSION / MDM / ASSESSMENT AND PLAN / ED COURSE  I reviewed the triage vital signs and the nursing notes.  Differential diagnosis includes, but is not limited to, bursitis, osteoarthritis, Baker's cyst, fracture, gout  Patient's presentation is most consistent with acute complicated illness / injury requiring diagnostic workup. Patient's diagnosis is consistent with bursitis, x-ray are negative for fracture, joint fusion or dislocation. Patient will be discharged home with prescriptions for Meloxicam and naproxen. Patient is to follow up with PCP for orthopedics referral as needed or otherwise directed. Patient is given ED precautions to return to the ED for  any worsening or new symptoms. Discussed plan of care with patient, answered all of patient's questions, Patient agreeable to plan of care.  Advised patient to take medications according to the instructions on the label. Discussed possible side effects of new medications. Patient verbalized understanding.      FINAL CLINICAL IMPRESSION(S) / ED DIAGNOSES   Final diagnoses:  Infrapatellar bursitis of left knee     Rx / DC Orders   ED Discharge Orders          Ordered    Ambulatory Referral to Primary Care (Establish Care)        08/22/23 1214    meloxicam (MOBIC) 15 MG tablet  Daily        08/22/23 1234    naproxen (EC NAPROSYN) 500 MG EC tablet  2 times daily with meals        08/22/23 1234             Note:  This document was prepared using Dragon voice recognition software and may include unintentional  dictation errors.   Gladys Damme, PA-C 08/22/23 1241    Pilar Jarvis, MD 08/22/23 1320

## 2023-08-22 NOTE — ED Provider Notes (Signed)
Shared visit  Patient presents to the emergency department with left knee pain.  No falls or trauma.  On exam patient with mild soft tissue swelling just to the inferior, lateral aspect just below the patella.  Full active and passive range of motion to the knee.  No overlying redness warmth or induration.  X-ray imaging overall reassuring.  Clinical picture is not consistent with a septic joint.  Have a low suspicion for gout or pseudogout.  Most likely with a bursitis or tendinitis.  Discussed symptomatic treatment.  Discussed close follow-up with primary care physician for further imaging of possible MRI and outpatient physical therapy.  Given return precautions for any ongoing or worsening symptoms.   Corena Herter, MD 08/22/23 1246

## 2023-08-22 NOTE — ED Triage Notes (Signed)
Pt reports pain to left knee for several months. Pt reports pain got better but the last 3 days it has been much worse. Denies injuries recent or in the past when then pain initially started.

## 2023-08-26 ENCOUNTER — Ambulatory Visit: Payer: Managed Care, Other (non HMO) | Admitting: Physician Assistant

## 2023-08-31 ENCOUNTER — Encounter: Payer: Self-pay | Admitting: Physician Assistant

## 2023-09-27 ENCOUNTER — Ambulatory Visit: Payer: Managed Care, Other (non HMO) | Admitting: Physician Assistant

## 2023-11-17 ENCOUNTER — Ambulatory Visit: Payer: Self-pay | Admitting: Physician Assistant

## 2023-12-17 ENCOUNTER — Emergency Department
Admission: EM | Admit: 2023-12-17 | Discharge: 2023-12-17 | Disposition: A | Discharge status: Home / Self Care | Attending: Emergency Medicine | Admitting: Emergency Medicine

## 2023-12-17 DIAGNOSIS — T310 Burns involving less than 10% of body surface: Secondary | ICD-10-CM | POA: Diagnosis not present

## 2023-12-17 DIAGNOSIS — X088XXA Exposure to other specified smoke, fire and flames, initial encounter: Secondary | ICD-10-CM | POA: Insufficient documentation

## 2023-12-17 DIAGNOSIS — Y990 Civilian activity done for income or pay: Secondary | ICD-10-CM | POA: Diagnosis not present

## 2023-12-17 DIAGNOSIS — T22011A Burn of unspecified degree of right forearm, initial encounter: Secondary | ICD-10-CM | POA: Diagnosis present

## 2023-12-17 DIAGNOSIS — Z23 Encounter for immunization: Secondary | ICD-10-CM | POA: Diagnosis not present

## 2023-12-17 LAB — CBC
HCT: 47.8 % (ref 39.0–52.0)
Hemoglobin: 16.2 g/dL (ref 13.0–17.0)
MCH: 30.4 pg (ref 26.0–34.0)
MCHC: 33.9 g/dL (ref 30.0–36.0)
MCV: 89.7 fL (ref 80.0–100.0)
Platelets: 287 10*3/uL (ref 150–400)
RBC: 5.33 MIL/uL (ref 4.22–5.81)
RDW: 13.1 % (ref 11.5–15.5)
WBC: 11.5 10*3/uL — ABNORMAL HIGH (ref 4.0–10.5)
nRBC: 0 % (ref 0.0–0.2)

## 2023-12-17 LAB — BASIC METABOLIC PANEL WITH GFR
Anion gap: 13 (ref 5–15)
BUN: 13 mg/dL (ref 6–20)
CO2: 22 mmol/L (ref 22–32)
Calcium: 8.9 mg/dL (ref 8.9–10.3)
Chloride: 104 mmol/L (ref 98–111)
Creatinine, Ser: 1.2 mg/dL (ref 0.61–1.24)
GFR, Estimated: >60 mL/min (ref >60)
Glucose, Bld: 88 mg/dL (ref 70–99)
Potassium: 3.7 mmol/L (ref 3.5–5.1)
Sodium: 139 mmol/L (ref 135–145)

## 2023-12-17 MED ORDER — ONDANSETRON 4 MG PO TBDP: 4.0000 mg | ORAL_TABLET | Freq: Three times a day (TID) | ORAL | 0 refills | Status: AC | PRN

## 2023-12-17 MED ORDER — TETANUS-DIPHTH-ACELL PERTUSSIS 5-2.5-18.5 LF-MCG/0.5 IM SUSY
0.5000 mL | PREFILLED_SYRINGE | Freq: Once | INTRAMUSCULAR | Status: AC
  Administered 2023-12-17: 0.5 mL via INTRAMUSCULAR
  Filled 2023-12-17: qty 0.5

## 2023-12-17 MED ORDER — OXYCODONE-ACETAMINOPHEN 5-325 MG PO TABS: 1.0000 | ORAL_TABLET | ORAL | 0 refills | Status: AC | PRN

## 2023-12-17 MED ORDER — BACITRACIN-NEOMYCIN-POLYMYXIN OINTMENT TUBE
TOPICAL_OINTMENT | Freq: Two times a day (BID) | CUTANEOUS | Status: DC
  Filled 2023-12-17: qty 28.4

## 2023-12-17 MED ORDER — HYDROMORPHONE HCL 1 MG/ML IJ SOLN
1.0000 mg | INTRAMUSCULAR | Status: DC | PRN
  Administered 2023-12-17: 1 mg via INTRAVENOUS
  Filled 2023-12-17: qty 1

## 2023-12-17 MED ORDER — HYDROMORPHONE HCL 1 MG/ML IJ SOLN
1.0000 mg | Freq: Once | INTRAMUSCULAR | Status: AC
  Administered 2023-12-17: 1 mg via INTRAVENOUS
  Filled 2023-12-17: qty 1

## 2023-12-17 MED ORDER — MORPHINE SULFATE (PF) 4 MG/ML IV SOLN
6.0000 mg | Freq: Once | INTRAVENOUS | Status: AC
  Administered 2023-12-17: 6 mg via INTRAVENOUS
  Filled 2023-12-17: qty 2

## 2023-12-17 NOTE — ED Notes (Signed)
 Pt arm debrided and bandaged and wrapped.
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
## 2023-12-20 ENCOUNTER — Ambulatory Visit: Payer: Self-pay

## 2023-12-20 ENCOUNTER — Encounter: Payer: Self-pay | Admitting: Physician Assistant

## 2023-12-20 ENCOUNTER — Other Ambulatory Visit: Payer: Self-pay | Admitting: Physician Assistant

## 2023-12-20 DIAGNOSIS — T310 Burns involving less than 10% of body surface: Secondary | ICD-10-CM
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
## 2024-01-17 NOTE — Telephone Encounter (Signed)
Please see the pt message below

## 2024-02-21 ENCOUNTER — Ambulatory Visit: Admitting: Physician Assistant

## 2024-04-01 ENCOUNTER — Other Ambulatory Visit: Payer: Self-pay

## 2024-04-01 ENCOUNTER — Emergency Department
Admission: EM | Admit: 2024-04-01 | Discharge: 2024-04-01 | Disposition: A | Discharge status: Home / Self Care | Attending: Emergency Medicine | Admitting: Emergency Medicine

## 2024-04-01 ENCOUNTER — Encounter: Payer: Self-pay | Admitting: Medical Oncology

## 2024-04-01 ENCOUNTER — Emergency Department

## 2024-04-01 DIAGNOSIS — M25461 Effusion, right knee: Secondary | ICD-10-CM | POA: Insufficient documentation

## 2024-04-01 DIAGNOSIS — M25561 Pain in right knee: Secondary | ICD-10-CM | POA: Diagnosis present

## 2024-04-01 DIAGNOSIS — I10 Essential (primary) hypertension: Secondary | ICD-10-CM | POA: Insufficient documentation

## 2024-04-01 MED ORDER — KETOROLAC TROMETHAMINE 30 MG/ML IJ SOLN
30.0000 mg | Freq: Once | INTRAMUSCULAR | Status: AC
  Administered 2024-04-01: 30 mg via INTRAMUSCULAR
  Filled 2024-04-01: qty 1

## 2024-04-01 NOTE — ED Notes (Signed)
 Pt to xray

## 2024-04-01 NOTE — ED Provider Notes (Signed)
 [facility] Provider Note    Event Date/Time   First MD Initiated Contact with Patient 04/01/24 1114     (approximate)   History   Chief Complaint Knee Pain   HPI  Gregory Oneal is a 38 y.o. male with past medical history of hypertension and right ACL repair who presents to the ED complaining of knee pain.  Patient reports that he has been dealing with increasing pain, stiffness, and swelling in his right knee over the past 2 to 3 weeks.  He denies any specific injury, does state the pain increases when he tries to walk.  He has not been taking anything for his symptoms at home, denies any fevers or redness at the knee.     Physical Exam   Triage Vital Signs: ED Triage Vitals  Encounter Vitals Group     BP 04/01/24 1109 (!) 136/106     Girls Systolic BP Percentile --      Girls Diastolic BP Percentile --      Boys Systolic BP Percentile --      Boys Diastolic BP Percentile --      Pulse Rate 04/01/24 1109 75     Resp 04/01/24 1109 18     Temp 04/01/24 1109 97.8 F (36.6 C)     Temp Source 04/01/24 1109 Oral     SpO2 04/01/24 1109 98 %     Weight 04/01/24 1110 268 lb 15.4 oz (122 kg)     Height 04/01/24 1110 6' 5 (1.956 m)     Head Circumference --      Peak Flow --      Pain Score 04/01/24 1109 8     Pain Loc --      Pain Education --      Exclude from Growth Chart --     Most recent vital signs: Vitals:   04/01/24 1109  BP: (!) 136/106  Pulse: 75  Resp: 18  Temp: 97.8 F (36.6 C)  SpO2: 98%    Constitutional: Alert and oriented. Eyes: Conjunctivae are normal. Head: Atraumatic. Nose: No congestion/rhinnorhea. Mouth/Throat: Mucous membranes are moist.  Cardiovascular: Normal rate, regular rhythm. Grossly normal heart sounds.  2+ radial pulses bilaterally. Respiratory: Normal respiratory effort.  No retractions. Lungs CTAB. Gastrointestinal: Soft and nontender. No distention. Musculoskeletal: Mild edema of the right knee  with diffuse tenderness, no erythema or warmth noted and range of motion is intact with minimal pain. Neurologic:  Normal speech and language. No gross focal neurologic deficits are appreciated.    ED Results / Procedures / Treatments   Labs (all labs ordered are listed, but only abnormal results are displayed) Labs Reviewed - No data to display   RADIOLOGY Right knee x-ray reviewed and interpreted by me with no fracture or dislocation.  PROCEDURES:  Critical Care performed: No  Procedures   MEDICATIONS ORDERED IN ED: Medications  ketorolac  (TORADOL ) 30 MG/ML injection 30 mg (30 mg Intramuscular Given 04/01/24 1129)     IMPRESSION / MDM / ASSESSMENT AND PLAN / ED COURSE  I reviewed the triage vital signs and the nursing notes.                              38 y.o. male with past medical history of hypertension and right ACL repair who presents to the ED complaining of increasing pain and swelling of his right knee over the past 2 to 3 weeks.  Patient's presentation is most consistent with acute complicated illness / injury requiring diagnostic workup.  Differential diagnosis includes, but is not limited to, fracture, dislocation, osteoarthritis, septic arthritis.  Patient nontoxic-appearing and in no acute distress, vital signs are unremarkable.  He has some swelling and tenderness of his right knee but there are no signs of infection and he is able to range the knee with minimal pain.  Will further assess with x-ray for occult injury, treat symptomatically with IM Toradol .  Right knee x-ray is unremarkable, pain improved following dose of IM Toradol .  Patient appropriate for discharge home with outpatient orthopedic follow-up, referral provided.  He was counseled to continue ice and NSAIDs, otherwise return to the ED for new or worsening symptoms.  Patient agrees with plan.      FINAL CLINICAL IMPRESSION(S) / ED DIAGNOSES   Final diagnoses:  Acute pain of right knee   Effusion of right knee     Rx / DC Orders   ED Discharge Orders     None        Note:  This document was prepared using Dragon voice recognition software and may include unintentional dictation errors.   Willo Dunnings, MD 04/01/24 1217

## 2024-04-01 NOTE — ED Triage Notes (Signed)
 Pt reports rt knee pain, hx of ACL issues, denies injury but has been having pain for several days. Pt is ambulatory.

## 2024-05-03 ENCOUNTER — Encounter: Payer: Self-pay | Admitting: Physician Assistant

## 2024-05-04 ENCOUNTER — Encounter: Admitting: Physician Assistant

## 2024-05-31 ENCOUNTER — Encounter: Admitting: Physician Assistant

## 2024-05-31 NOTE — Progress Notes (Deleted)
 Established patient visit  Patient: Gregory Oneal   DOB: 1985-09-28   38 y.o. Male  MRN: [id number] Visit Date: 05/31/2024  Today's healthcare provider: Jolynn Spencer, PA-C   No chief complaint on file.  Subjective       Discussed the use of AI scribe software for clinical note transcription with the patient, who gave verbal consent to proceed.  History of Present Illness        05/07/2023   10:54 AM  Depression screen PHQ 2/9  Decreased Interest 0  Down, Depressed, Hopeless 0  PHQ - 2 Score 0      05/07/2023   10:55 AM  GAD 7 : Generalized Anxiety Score  Nervous, Anxious, on Edge 0  Control/stop worrying 0  Worry too much - different things 0  Trouble relaxing 0  Restless 0  Easily annoyed or irritable 0  Afraid - awful might happen 0  Total GAD 7 Score 0    Medications: Outpatient Medications Prior to Visit  Medication Sig  . albuterol  (VENTOLIN  HFA) 108 (90 Base) MCG/ACT inhaler Inhale 1-2 puffs into the lungs every 6 (six) hours as needed.  . amLODipine  (NORVASC ) 5 MG tablet Take 1 tablet (5 mg total) by mouth daily.  . ondansetron  (ZOFRAN -ODT) 4 MG disintegrating tablet Take 1 tablet (4 mg total) by mouth every 8 (eight) hours as needed for nausea or vomiting.   No facility-administered medications prior to visit.    Review of Systems  All other systems reviewed and are negative.  All negative Except see HPI   {Insert previous labs (optional):23779} {See past labs  Heme  Chem  Endocrine  Serology  Results Review (optional):1}   Objective    There were no vitals taken for this visit. {Insert last BP/Wt (optional):23777}{See vitals history (optional):1}   Physical Exam Vitals reviewed.  Constitutional:      General: He is not in acute distress.    Appearance: Normal appearance. He is not diaphoretic.  HENT:     Head: Normocephalic and atraumatic.  Eyes:     General: No scleral icterus.    Conjunctiva/sclera: Conjunctivae normal.   Cardiovascular:     Rate and Rhythm: Normal rate and regular rhythm.     Pulses: Normal pulses.     Heart sounds: Normal heart sounds. No murmur heard. Pulmonary:     Effort: Pulmonary effort is normal. No respiratory distress.     Breath sounds: Normal breath sounds. No wheezing or rhonchi.  Musculoskeletal:     Cervical back: Neck supple.     Right lower leg: No edema.     Left lower leg: No edema.  Lymphadenopathy:     Cervical: No cervical adenopathy.  Skin:    General: Skin is warm and dry.     Findings: No rash.  Neurological:     Mental Status: He is alert and oriented to person, place, and time. Mental status is at baseline.  Psychiatric:        Mood and Affect: Mood normal.        Behavior: Behavior normal.     No results found for any visits on 05/31/24.      Assessment and Plan Assessment & Plan     No orders of the defined types were placed in this encounter.   No follow-ups on file.   The patient was advised to call back or seek an in-person evaluation if the symptoms worsen or if the condition fails to improve as anticipated.  I  discussed the assessment and treatment plan with the patient. The patient was provided an opportunity to ask questions and all were answered. The patient agreed with the plan and demonstrated an understanding of the instructions.  I, Mussa Groesbeck, PA-C have reviewed all documentation for this visit. The documentation on 05/31/2024  for the exam, diagnosis, procedures, and orders are all accurate and complete.  Jolynn Spencer, [facility], MMS [facility] [phone] (phone) [phone] (fax)  [facility]

## 2024-06-12 ENCOUNTER — Encounter: Payer: Self-pay | Admitting: Physician Assistant

## 2024-06-13 NOTE — Telephone Encounter (Signed)
 Pt has read message by another clinical staff. Last read by Lamar Morris at 12:10PM on 06/13/2024.
